# Patient Record
Sex: Female | Born: 1985 | Race: Black or African American | Hispanic: No | Marital: Married | Smoking: Never smoker
Health system: Southern US, Community
[De-identification: ages and names within clinical notes are randomized; demographics above are authoritative.]

## PROBLEM LIST (undated history)

## (undated) DIAGNOSIS — E782 Mixed hyperlipidemia: Secondary | ICD-10-CM

## (undated) DIAGNOSIS — N200 Calculus of kidney: Secondary | ICD-10-CM

## (undated) DIAGNOSIS — R7303 Prediabetes: Secondary | ICD-10-CM

## (undated) DIAGNOSIS — F319 Bipolar disorder, unspecified: Secondary | ICD-10-CM

## (undated) DIAGNOSIS — F32A Depression, unspecified: Secondary | ICD-10-CM

## (undated) DIAGNOSIS — Z8679 Personal history of other diseases of the circulatory system: Secondary | ICD-10-CM

## (undated) DIAGNOSIS — F419 Anxiety disorder, unspecified: Secondary | ICD-10-CM

## (undated) DIAGNOSIS — F329 Major depressive disorder, single episode, unspecified: Secondary | ICD-10-CM

## (undated) DIAGNOSIS — I10 Essential (primary) hypertension: Secondary | ICD-10-CM

## (undated) HISTORY — DX: Essential (primary) hypertension: I10

## (undated) HISTORY — DX: Bipolar disorder, unspecified: F31.9

## (undated) HISTORY — PX: TUBAL LIGATION: SHX77

## (undated) HISTORY — PX: LITHOTRIPSY: SUR834

## (undated) HISTORY — DX: Mixed hyperlipidemia: E78.2

## (undated) HISTORY — PX: HERNIA REPAIR: SHX51

## (undated) HISTORY — DX: Calculus of kidney: N20.0

## (undated) HISTORY — DX: Major depressive disorder, single episode, unspecified: F32.9

## (undated) HISTORY — DX: Anxiety disorder, unspecified: F41.9

## (undated) HISTORY — DX: Personal history of other diseases of the circulatory system: Z86.79

## (undated) HISTORY — DX: Depression, unspecified: F32.A

## (undated) HISTORY — DX: Prediabetes: R73.03

---

## 2007-07-21 ENCOUNTER — Emergency Department: Payer: Self-pay | Admitting: Emergency Medicine

## 2008-02-24 ENCOUNTER — Inpatient Hospital Stay (HOSPITAL_COMMUNITY): Admission: AD | Admit: 2008-02-24 | Discharge: 2008-02-24 | Payer: Self-pay | Admitting: Obstetrics & Gynecology

## 2008-03-05 ENCOUNTER — Inpatient Hospital Stay (HOSPITAL_COMMUNITY): Admission: RE | Admit: 2008-03-05 | Discharge: 2008-03-08 | Payer: Self-pay | Admitting: Obstetrics & Gynecology

## 2008-03-06 ENCOUNTER — Encounter: Payer: Self-pay | Admitting: Obstetrics & Gynecology

## 2008-03-12 ENCOUNTER — Emergency Department: Payer: Self-pay | Admitting: Internal Medicine

## 2008-03-12 ENCOUNTER — Inpatient Hospital Stay (HOSPITAL_COMMUNITY): Admission: AD | Admit: 2008-03-12 | Discharge: 2008-03-14 | Payer: Self-pay | Admitting: Obstetrics

## 2009-07-02 ENCOUNTER — Ambulatory Visit: Payer: Self-pay | Admitting: Surgery

## 2009-07-23 ENCOUNTER — Ambulatory Visit: Payer: Self-pay | Admitting: Surgery

## 2009-10-13 ENCOUNTER — Emergency Department: Payer: Self-pay | Admitting: Emergency Medicine

## 2010-02-07 ENCOUNTER — Encounter: Payer: Self-pay | Admitting: Obstetrics

## 2010-05-03 LAB — COMPREHENSIVE METABOLIC PANEL
ALT: 18 U/L (ref 0–35)
ALT: 22 U/L (ref 0–35)
ALT: 25 U/L (ref 0–35)
ALT: 28 U/L (ref 0–35)
AST: 22 U/L (ref 0–37)
AST: 22 U/L (ref 0–37)
AST: 25 U/L (ref 0–37)
AST: 31 U/L (ref 0–37)
Albumin: 2.9 g/dL — ABNORMAL LOW (ref 3.5–5.2)
Albumin: 2.7 g/dL — ABNORMAL LOW (ref 3.5–5.2)
Albumin: 3 g/dL — ABNORMAL LOW (ref 3.5–5.2)
Albumin: 3 g/dL — ABNORMAL LOW (ref 3.5–5.2)
Alkaline Phosphatase: 188 U/L — ABNORMAL HIGH (ref 39–117)
Alkaline Phosphatase: 150 U/L — ABNORMAL HIGH (ref 39–117)
Alkaline Phosphatase: 196 U/L — ABNORMAL HIGH (ref 39–117)
Alkaline Phosphatase: 215 U/L — ABNORMAL HIGH (ref 39–117)
BUN: 5 mg/dL — ABNORMAL LOW (ref 6–23)
BUN: 1 mg/dL — ABNORMAL LOW (ref 6–23)
BUN: 4 mg/dL — ABNORMAL LOW (ref 6–23)
BUN: 6 mg/dL (ref 6–23)
CO2: 22 mEq/L (ref 19–32)
CO2: 23 mEq/L (ref 19–32)
CO2: 27 mEq/L (ref 19–32)
CO2: 28 mEq/L (ref 19–32)
Calcium: 8.5 mg/dL (ref 8.4–10.5)
Calcium: 7.1 mg/dL — ABNORMAL LOW (ref 8.4–10.5)
Calcium: 8.8 mg/dL (ref 8.4–10.5)
Calcium: 8.9 mg/dL (ref 8.4–10.5)
Chloride: 106 mEq/L (ref 96–112)
Chloride: 106 mEq/L (ref 96–112)
Chloride: 106 mEq/L (ref 96–112)
Chloride: 106 mEq/L (ref 96–112)
Creatinine, Ser: 0.43 mg/dL (ref 0.4–1.2)
Creatinine, Ser: 0.41 mg/dL (ref 0.4–1.2)
Creatinine, Ser: 0.52 mg/dL (ref 0.4–1.2)
Creatinine, Ser: 0.7 mg/dL (ref 0.4–1.2)
GFR calc Af Amer: >60 mL/min (ref >60)
GFR calc Af Amer: >60 mL/min (ref >60)
GFR calc Af Amer: >60 mL/min (ref >60)
GFR calc Af Amer: >60 mL/min (ref >60)
GFR calc non Af Amer: >60 mL/min (ref >60)
GFR calc non Af Amer: >60 mL/min (ref >60)
GFR calc non Af Amer: >60 mL/min (ref >60)
GFR calc non Af Amer: >60 mL/min (ref >60)
Glucose, Bld: 76 mg/dL (ref 70–99)
Glucose, Bld: 69 mg/dL — ABNORMAL LOW (ref 70–99)
Glucose, Bld: 84 mg/dL (ref 70–99)
Glucose, Bld: 86 mg/dL (ref 70–99)
Potassium: 3.7 mEq/L (ref 3.5–5.1)
Potassium: 3.5 mEq/L (ref 3.5–5.1)
Potassium: 3.7 mEq/L (ref 3.5–5.1)
Potassium: 3.8 mEq/L (ref 3.5–5.1)
Sodium: 135 mEq/L (ref 135–145)
Sodium: 136 mEq/L (ref 135–145)
Sodium: 138 mEq/L (ref 135–145)
Sodium: 138 mEq/L (ref 135–145)
Total Bilirubin: 0.6 mg/dL (ref 0.3–1.2)
Total Bilirubin: 0.2 mg/dL — ABNORMAL LOW (ref 0.3–1.2)
Total Bilirubin: 0.4 mg/dL (ref 0.3–1.2)
Total Bilirubin: 0.4 mg/dL (ref 0.3–1.2)
Total Protein: 6.3 g/dL (ref 6.0–8.3)
Total Protein: 5.7 g/dL — ABNORMAL LOW (ref 6.0–8.3)
Total Protein: 6 g/dL (ref 6.0–8.3)
Total Protein: 6.4 g/dL (ref 6.0–8.3)

## 2010-05-03 LAB — CBC
HCT: 33.5 % — ABNORMAL LOW (ref 36.0–46.0)
HCT: 30.2 % — ABNORMAL LOW (ref 36.0–46.0)
HCT: 30.4 % — ABNORMAL LOW (ref 36.0–46.0)
HCT: 34.8 % — ABNORMAL LOW (ref 36.0–46.0)
HCT: 37.3 % (ref 36.0–46.0)
Hemoglobin: 11.6 g/dL — ABNORMAL LOW (ref 12.0–15.0)
Hemoglobin: 10.2 g/dL — ABNORMAL LOW (ref 12.0–15.0)
Hemoglobin: 10.2 g/dL — ABNORMAL LOW (ref 12.0–15.0)
Hemoglobin: 11.8 g/dL — ABNORMAL LOW (ref 12.0–15.0)
Hemoglobin: 12.7 g/dL (ref 12.0–15.0)
MCHC: 34.5 g/dL (ref 30.0–36.0)
MCHC: 33.6 g/dL (ref 30.0–36.0)
MCHC: 33.7 g/dL (ref 30.0–36.0)
MCHC: 33.9 g/dL (ref 30.0–36.0)
MCHC: 34 g/dL (ref 30.0–36.0)
MCV: 93.1 fL (ref 78.0–100.0)
MCV: 92.8 fL (ref 78.0–100.0)
MCV: 93.1 fL (ref 78.0–100.0)
MCV: 93.8 fL (ref 78.0–100.0)
MCV: 94.1 fL (ref 78.0–100.0)
Platelets: 194 10*3/uL (ref 150–400)
Platelets: 188 10*3/uL (ref 150–400)
Platelets: 206 10*3/uL (ref 150–400)
Platelets: 210 10*3/uL (ref 150–400)
Platelets: 294 10*3/uL (ref 150–400)
RBC: 3.6 MIL/uL — ABNORMAL LOW (ref 3.87–5.11)
RBC: 3.22 MIL/uL — ABNORMAL LOW (ref 3.87–5.11)
RBC: 3.23 MIL/uL — ABNORMAL LOW (ref 3.87–5.11)
RBC: 3.76 MIL/uL — ABNORMAL LOW (ref 3.87–5.11)
RBC: 4.01 MIL/uL (ref 3.87–5.11)
RDW: 12.7 % (ref 11.5–15.5)
RDW: 12.5 % (ref 11.5–15.5)
RDW: 12.9 % (ref 11.5–15.5)
RDW: 12.9 % (ref 11.5–15.5)
RDW: 13.1 % (ref 11.5–15.5)
WBC: 11.9 10*3/uL — ABNORMAL HIGH (ref 4.0–10.5)
WBC: 10.5 10*3/uL (ref 4.0–10.5)
WBC: 11.3 10*3/uL — ABNORMAL HIGH (ref 4.0–10.5)
WBC: 12.9 10*3/uL — ABNORMAL HIGH (ref 4.0–10.5)
WBC: 9.1 10*3/uL (ref 4.0–10.5)

## 2010-05-03 LAB — URIC ACID
Uric Acid, Serum: 4.4 mg/dL (ref 2.4–7.0)
Uric Acid, Serum: 3.9 mg/dL (ref 2.4–7.0)
Uric Acid, Serum: 4 mg/dL (ref 2.4–7.0)
Uric Acid, Serum: 6.8 mg/dL (ref 2.4–7.0)

## 2010-05-03 LAB — URINALYSIS, DIPSTICK ONLY
Bilirubin Urine: NEGATIVE
Glucose, UA: NEGATIVE mg/dL
Ketones, ur: 15 mg/dL — AB
Nitrite: NEGATIVE
Protein, ur: NEGATIVE mg/dL
Specific Gravity, Urine: 1.02 (ref 1.005–1.030)
Urobilinogen, UA: 0.2 mg/dL (ref 0.0–1.0)
pH: 6.5 (ref 5.0–8.0)

## 2010-05-03 LAB — LACTATE DEHYDROGENASE
LDH: 151 U/L (ref 94–250)
LDH: 166 U/L (ref 94–250)
LDH: 211 U/L (ref 94–250)
LDH: 236 U/L (ref 94–250)

## 2010-05-03 LAB — URINALYSIS, ROUTINE W REFLEX MICROSCOPIC
Bilirubin Urine: NEGATIVE
Glucose, UA: NEGATIVE mg/dL
Hgb urine dipstick: NEGATIVE
Ketones, ur: 15 mg/dL — AB
Nitrite: NEGATIVE
Protein, ur: NEGATIVE mg/dL
Specific Gravity, Urine: 1.015 (ref 1.005–1.030)
Urobilinogen, UA: 0.2 mg/dL (ref 0.0–1.0)
pH: 6.5 (ref 5.0–8.0)

## 2010-05-03 LAB — MAGNESIUM: Magnesium: 5 mg/dL — ABNORMAL HIGH (ref 1.5–2.5)

## 2010-05-03 LAB — RPR: RPR Ser Ql: NONREACTIVE

## 2010-05-31 NOTE — H&P (Signed)
NAME:  Jasmine Barnes, Jasmine Barnes               ACCOUNT NO.:  [id number]   MEDICAL RECORD NO.:  [id number]          PATIENT TYPE:  INP   LOCATION:  9167                          FACILITY:  [facility]   PHYSICIAN:  Roseanna Rainbow, M.D.DATE OF BIRTH:  [birth year]   DATE OF ADMISSION:  03/05/2008  DATE OF DISCHARGE:                              HISTORY & PHYSICAL   CHIEF COMPLAINT:  The patient is a 25 year old para 1 with an estimated  date of confinement of March 20, 2008, with an intrauterine pregnancy at  37.6 weeks with a history of chronic hypertension, now w/rule out  superimposed pregnancy-induced hypertension.   HISTORY OF PRESENT ILLNESS:  The patient had been followed  collaboratively at [facility].  Her blood pressures had  been controlled with Aldomet.  The patient had been seen for routine  visit and antenatal testing at [facility] 4 days ago.  Per the patient, her blood pressures were elevated and the dose of the  Aldomet was increased.  She denies any neurological symptoms today.  However, she does report nausea and vomiting.   ALLERGIES:  PENICILLIN.   MEDICATIONS:  Please see the medication reconciliation form.   OBSTETRIC RISK FACTORS:  Chronic hypertension.   PAST OBSTETRICAL HISTORY:  In November 2004, she was delivered of a live  born female at 64 weeks vaginal delivery and that pregnancy was  complicated by pregnancy-induced hypertension.   PRENATAL LABORATORY DATA:  Chlamydia probe negative.  Urine culture and  sensitivity insignificant growth.  GC probe negative.  One-hour GCT 88.  Hepatitis B surface antigen negative, hematocrit 32.2, hemoglobin 10.9,  HIV nonreactive, platelets 234,000.  Blood type is AB positive, antibody  screen negative, RPR nonreactive, rubella immune, sickle cell negative.  An ultrasound on January 30, 2008, at 32 weeks 6 days, normal amniotic  fluid index, anterior placenta, no previa.  Estimated fetal weight  percentile was at the 67th percentile.   PAST GYNECOLOGIC HISTORY:  There is a history of a D&C.  There is a  history of gonorrhea.   PAST MEDICAL HISTORY:  Iron-deficiency anemia, asthma, depression,  tension headaches.  Please see the above.   PAST SURGICAL HISTORY:  Please see the above oral surgery.   SOCIAL HISTORY:  She is a Lawyer.  She is single, living with the  significant other, does not give any significant history of alcohol  usage, has no significant smoking history.  Stress issues, financial  difficulties.  There is a previous marijuana use.   FAMILY HISTORY:  Remarkable for depression, hypertension.   REVIEW OF SYSTEMS:  NEUROLOGIC:  She denies any headaches, visual  disturbances.  GI: She was complaining of nausea, vomiting.  VITAL SIGNS:  Blood pressure is 160s/100s.  GENERAL:  No apparent distress.  ABDOMEN:  Gravid, nontender.  VAGINA:  Sterile vaginal exam.  The cervix is at least 260% effaced with  the vertex at -2 station.  Fetal heart rate by Doppler 140 beats per  minute.  There was no urinalysis performed in the office.   ASSESSMENT:  Multipara at 37 plus weeks with chronic  hypertension, rule  out superimposed pregnancy-induced hypertension.   PLAN:  Induction of labor.  Low-dose Pitocin per protocol.  We will also  check a PIH panel.  We will start magnesium sulfate for seizure  prophylaxis for severe criteria for PIH.      Roseanna Rainbow, M.D.  Electronically Signed     LAJ/MEDQ  D:  03/05/2008  T:  03/06/2008  Job:  [id number]

## 2011-10-23 ENCOUNTER — Encounter: Payer: Self-pay | Admitting: Obstetrics and Gynecology

## 2012-01-22 ENCOUNTER — Encounter: Payer: Self-pay | Admitting: Maternal and Fetal Medicine

## 2012-03-25 ENCOUNTER — Encounter: Payer: Self-pay | Admitting: Maternal & Fetal Medicine

## 2012-04-22 ENCOUNTER — Observation Stay: Payer: Self-pay

## 2012-04-22 LAB — PIH PROFILE
Anion Gap: 6 — ABNORMAL LOW (ref 7–16)
BUN: 6 mg/dL — ABNORMAL LOW (ref 7–18)
Calcium, Total: 8.7 mg/dL (ref 8.5–10.1)
Chloride: 106 mmol/L (ref 98–107)
Co2: 26 mmol/L (ref 21–32)
Creatinine: 0.49 mg/dL — ABNORMAL LOW (ref 0.60–1.30)
EGFR (African American): >60
EGFR (Non-African Amer.): >60
Glucose: 71 mg/dL (ref 65–99)
HCT: 34.7 % — ABNORMAL LOW (ref 35.0–47.0)
HGB: 11.9 g/dL — ABNORMAL LOW (ref 12.0–16.0)
MCH: 30.4 pg (ref 26.0–34.0)
MCHC: 34.4 g/dL (ref 32.0–36.0)
MCV: 89 fL (ref 80–100)
Osmolality: 272 (ref 275–301)
Platelet: 219 10*3/uL (ref 150–440)
Potassium: 3.4 mmol/L — ABNORMAL LOW (ref 3.5–5.1)
RBC: 3.92 10*6/uL (ref 3.80–5.20)
RDW: 12.2 % (ref 11.5–14.5)
SGOT(AST): 23 U/L (ref 15–37)
Sodium: 138 mmol/L (ref 136–145)
Uric Acid: 3.2 mg/dL (ref 2.6–6.0)
WBC: 10.3 10*3/uL (ref 3.6–11.0)

## 2012-04-22 LAB — URINALYSIS, COMPLETE
Bilirubin,UR: NEGATIVE
Blood: NEGATIVE
Glucose,UR: NEGATIVE mg/dL (ref 0–75)
Ketone: NEGATIVE
Nitrite: NEGATIVE
Ph: 7 (ref 4.5–8.0)
Protein: NEGATIVE
RBC,UR: 1 /HPF (ref 0–5)
Specific Gravity: 1.004 (ref 1.003–1.030)
Squamous Epithelial: 1
WBC UR: 3 /HPF (ref 0–5)

## 2012-04-22 LAB — PROTEIN / CREATININE RATIO, URINE
Creatinine, Urine: 297.7 mg/dL — ABNORMAL HIGH (ref 30.0–125.0)
Protein, Random Urine: 50 mg/dL — ABNORMAL HIGH (ref 0–12)
Protein/Creat. Ratio: 168 mg/gCREAT (ref 0–200)

## 2012-04-25 ENCOUNTER — Observation Stay: Payer: Self-pay | Admitting: Obstetrics and Gynecology

## 2012-04-25 LAB — BASIC METABOLIC PANEL
Anion Gap: 10 (ref 7–16)
BUN: 7 mg/dL (ref 7–18)
Calcium, Total: 8.1 mg/dL — ABNORMAL LOW (ref 8.5–10.1)
Chloride: 107 mmol/L (ref 98–107)
Co2: 22 mmol/L (ref 21–32)
Creatinine: 0.48 mg/dL — ABNORMAL LOW (ref 0.60–1.30)
EGFR (African American): >60
EGFR (Non-African Amer.): >60
Glucose: 95 mg/dL (ref 65–99)
Osmolality: 275 (ref 275–301)
Potassium: 3.3 mmol/L — ABNORMAL LOW (ref 3.5–5.1)
Sodium: 139 mmol/L (ref 136–145)

## 2012-04-25 LAB — RBC: RBC: 3.62 10*6/uL — ABNORMAL LOW (ref 3.80–5.20)

## 2012-04-25 LAB — PLATELET COUNT: Platelet: 221 10*3/uL (ref 150–440)

## 2012-04-25 LAB — SGOT (AST)(ARMC): SGOT(AST): 24 U/L (ref 15–37)

## 2012-04-25 LAB — URIC ACID: Uric Acid: 3.4 mg/dL (ref 2.6–6.0)

## 2012-04-25 LAB — WBC: WBC: 10.4 10*3/uL (ref 3.6–11.0)

## 2012-04-25 LAB — HEMOGLOBIN: HGB: 11.1 g/dL — ABNORMAL LOW (ref 12.0–16.0)

## 2012-04-25 LAB — HEMATOCRIT: HCT: 32.2 % — ABNORMAL LOW (ref 35.0–47.0)

## 2012-05-09 ENCOUNTER — Inpatient Hospital Stay: Payer: Self-pay | Admitting: Obstetrics and Gynecology

## 2012-05-09 LAB — CBC WITH DIFFERENTIAL/PLATELET
Basophil #: 0.1 10*3/uL (ref 0.0–0.1)
Basophil %: 0.9 %
Eosinophil #: 0.1 10*3/uL (ref 0.0–0.7)
Eosinophil %: 1.4 %
HCT: 33.6 % — ABNORMAL LOW (ref 35.0–47.0)
HGB: 11.4 g/dL — ABNORMAL LOW (ref 12.0–16.0)
Lymphocyte #: 2 10*3/uL (ref 1.0–3.6)
Lymphocyte %: 21.1 %
MCH: 29.2 pg (ref 26.0–34.0)
MCHC: 33.9 g/dL (ref 32.0–36.0)
MCV: 86 fL (ref 80–100)
Monocyte #: 0.6 x10 3/mm (ref 0.2–0.9)
Monocyte %: 6.7 %
Neutrophil #: 6.5 10*3/uL (ref 1.4–6.5)
Neutrophil %: 69.9 %
Platelet: 206 10*3/uL (ref 150–440)
RBC: 3.9 10*6/uL (ref 3.80–5.20)
RDW: 12.2 % (ref 11.5–14.5)
WBC: 9.3 10*3/uL (ref 3.6–11.0)

## 2012-05-09 LAB — BASIC METABOLIC PANEL
Anion Gap: 7 (ref 7–16)
BUN: 5 mg/dL — ABNORMAL LOW (ref 7–18)
Calcium, Total: 8.2 mg/dL — ABNORMAL LOW (ref 8.5–10.1)
Chloride: 106 mmol/L (ref 98–107)
Co2: 23 mmol/L (ref 21–32)
Creatinine: 0.53 mg/dL — ABNORMAL LOW (ref 0.60–1.30)
EGFR (African American): >60
EGFR (Non-African Amer.): >60
Glucose: 74 mg/dL (ref 65–99)
Osmolality: 268 (ref 275–301)
Potassium: 3.7 mmol/L (ref 3.5–5.1)
Sodium: 136 mmol/L (ref 136–145)

## 2012-05-09 LAB — DRUG SCREEN, URINE
Amphetamines, Ur Screen: NEGATIVE (ref ?–1000)
Barbiturates, Ur Screen: NEGATIVE (ref ?–200)
Benzodiazepine, Ur Scrn: NEGATIVE (ref ?–200)
Cannabinoid 50 Ng, Ur ~~LOC~~: NEGATIVE (ref ?–50)
Cocaine Metabolite,Ur ~~LOC~~: NEGATIVE (ref ?–300)
MDMA (Ecstasy)Ur Screen: POSITIVE (ref ?–500)
Methadone, Ur Screen: NEGATIVE (ref ?–300)
Opiate, Ur Screen: NEGATIVE (ref ?–300)
Phencyclidine (PCP) Ur S: NEGATIVE (ref ?–25)
Tricyclic, Ur Screen: NEGATIVE (ref ?–1000)

## 2012-05-09 LAB — URIC ACID: Uric Acid: 3.2 mg/dL (ref 2.6–6.0)

## 2012-05-09 LAB — SGOT (AST)(ARMC): SGOT(AST): 22 U/L (ref 15–37)

## 2012-05-09 LAB — PROTEIN / CREATININE RATIO, URINE
Creatinine, Urine: 215.4 mg/dL — ABNORMAL HIGH (ref 30.0–125.0)
Protein, Random Urine: 49 mg/dL — ABNORMAL HIGH (ref 0–12)
Protein/Creat. Ratio: 227 mg/gCREAT — ABNORMAL HIGH (ref 0–200)

## 2012-05-10 LAB — HEMATOCRIT: HCT: 30.1 % — ABNORMAL LOW (ref 35.0–47.0)

## 2012-05-22 ENCOUNTER — Ambulatory Visit: Payer: Self-pay | Admitting: Obstetrics and Gynecology

## 2012-05-22 LAB — CBC
HCT: 37 % (ref 35.0–47.0)
HGB: 12.7 g/dL (ref 12.0–16.0)
MCH: 29.4 pg (ref 26.0–34.0)
MCHC: 34.4 g/dL (ref 32.0–36.0)
MCV: 86 fL (ref 80–100)
Platelet: 312 10*3/uL (ref 150–440)
RBC: 4.32 10*6/uL (ref 3.80–5.20)
RDW: 12.8 % (ref 11.5–14.5)
WBC: 8 10*3/uL (ref 3.6–11.0)

## 2012-06-11 ENCOUNTER — Ambulatory Visit: Payer: Self-pay | Admitting: Obstetrics and Gynecology

## 2012-09-27 ENCOUNTER — Emergency Department: Payer: Self-pay | Admitting: Emergency Medicine

## 2012-09-27 LAB — BASIC METABOLIC PANEL
Anion Gap: 4 — ABNORMAL LOW (ref 7–16)
BUN: 13 mg/dL (ref 7–18)
Calcium, Total: 9.5 mg/dL (ref 8.5–10.1)
Chloride: 105 mmol/L (ref 98–107)
Co2: 28 mmol/L (ref 21–32)
Creatinine: 0.65 mg/dL (ref 0.60–1.30)
EGFR (African American): >60
EGFR (Non-African Amer.): >60
Glucose: 88 mg/dL (ref 65–99)
Osmolality: 273 (ref 275–301)
Potassium: 3.7 mmol/L (ref 3.5–5.1)
Sodium: 137 mmol/L (ref 136–145)

## 2012-09-27 LAB — HEPATIC FUNCTION PANEL A (ARMC)
Albumin: 4.1 g/dL (ref 3.4–5.0)
Alkaline Phosphatase: 101 U/L (ref 50–136)
Bilirubin, Direct: <0.1 mg/dL (ref 0.00–0.20)
Bilirubin,Total: 0.3 mg/dL (ref 0.2–1.0)
SGOT(AST): 30 U/L (ref 15–37)
SGPT (ALT): 32 U/L (ref 12–78)
Total Protein: 8 g/dL (ref 6.4–8.2)

## 2012-09-27 LAB — DIFFERENTIAL
Basophil #: 0.1 10*3/uL (ref 0.0–0.1)
Basophil %: 1.3 %
Eosinophil #: 0.2 10*3/uL (ref 0.0–0.7)
Eosinophil %: 2.6 %
Lymphocyte #: 1.8 10*3/uL (ref 1.0–3.6)
Lymphocyte %: 27.8 %
Monocyte #: 0.5 x10 3/mm (ref 0.2–0.9)
Monocyte %: 7.2 %
Neutrophil #: 4 10*3/uL (ref 1.4–6.5)
Neutrophil %: 61.1 %

## 2012-09-27 LAB — URINALYSIS, COMPLETE
Bilirubin,UR: NEGATIVE
Glucose,UR: NEGATIVE mg/dL (ref 0–75)
Hyaline Cast: 2
Ketone: NEGATIVE
Leukocyte Esterase: NEGATIVE
Nitrite: NEGATIVE
Ph: 7 (ref 4.5–8.0)
Protein: 30
RBC,UR: 2 /HPF (ref 0–5)
Specific Gravity: 1.026 (ref 1.003–1.030)
Squamous Epithelial: 7
WBC UR: 6 /HPF (ref 0–5)

## 2012-09-27 LAB — SEDIMENTATION RATE: Erythrocyte Sed Rate: 16 mm/hr (ref 0–20)

## 2012-09-27 LAB — HEMOGLOBIN: HGB: 13.8 g/dL (ref 12.0–16.0)

## 2012-09-27 LAB — TROPONIN I: Troponin-I: <0.02 ng/mL

## 2013-03-23 ENCOUNTER — Emergency Department: Payer: Self-pay | Admitting: Emergency Medicine

## 2014-01-23 ENCOUNTER — Ambulatory Visit: Payer: Self-pay

## 2014-01-29 ENCOUNTER — Ambulatory Visit: Payer: Self-pay | Admitting: Urology

## 2014-02-05 ENCOUNTER — Ambulatory Visit: Payer: Self-pay | Admitting: Urology

## 2014-03-09 ENCOUNTER — Ambulatory Visit: Payer: Self-pay | Admitting: Urology

## 2014-03-16 DIAGNOSIS — R519 Headache, unspecified: Secondary | ICD-10-CM | POA: Insufficient documentation

## 2014-03-16 DIAGNOSIS — R51 Headache: Secondary | ICD-10-CM

## 2014-03-18 ENCOUNTER — Ambulatory Visit: Payer: Self-pay

## 2014-03-20 ENCOUNTER — Emergency Department: Payer: Self-pay | Admitting: Emergency Medicine

## 2014-03-26 ENCOUNTER — Ambulatory Visit: Payer: Self-pay | Admitting: Urology

## 2014-04-06 ENCOUNTER — Ambulatory Visit: Payer: Self-pay | Admitting: Urology

## 2014-05-05 NOTE — Consult Note (Signed)
  Allergies:   Penicillin: Unknown              Electronic Signatures: Sharyn Creamer (MD)  (Signed 30-Sep-13 15:35)  Authored: Referral, Home Medications, Allergies, Consult, Lab/Radiology Notes, Impression, Other Comments Rahimtoola, Diane (RN)  (Signed 30-Sep-13 16:02)  Authored: Referral, Consult, Lab/Radiology Notes, Impression, Other Comments   Last Updated: 30-Sep-13 16:02 by Thomes Dinning (RN)

## 2014-05-05 NOTE — Consult Note (Signed)
Referral Information:   Reason for Referral 29 yo G9 P2062 LMP 08/15/11 with EDD 05/27/12 by US performed at [facility] on 10/03/11. Presently 9 weeks 3 days and referred due to poorly controllled hypertension(dx at age 23) and history of severe preeclampsia.    Referring Physician [facility]    Prenatal Hx as above.    Past Obstetrical Hx 2004, 39 weeks, 6lb 11 oz female, induced secondary to worsening hypertension.  [place], [place] 2010, 38 weeks, 6lb 10 oz, femaile, induced secondary to severe preeclampsia, [place], [place]. Reports visual problems, no edema, or pulm edema per pt. Reports SOB and follow up cardiology appt after 6w.  States cardiac exam was negative. Tab x 5 Sab x 1 H/o GC (treated) years ago   Home Medications: Medication Instructions Status  Zofran 8 mg oral tablet 1 tab(s) orally 2 times a day, As Needed- for Nausea, Vomiting  Active  labetalol 100 mg oral tablet 0.5 tab(s) orally once a day (at bedtime) Active   Allergies:   Penicillin: Swelling  Vital Signs/Notes:  Nursing Vital Signs: **Vital Signs.:   07-Oct-13 09:10   Vital Signs Type Routine   Temperature Temperature (F) 98.6   Celsius 37   Temperature Source oral   Pulse Pulse 76   Pulse source if not from Vital Sign Device dinamap   Respirations Respirations 16   Systolic BP Systolic BP 045   Diastolic BP (mmHg) Diastolic BP (mmHg) 89   Mean BP 112   BP Source  if not from Vital Sign Device dinamap,  rt arm large cuff pt sitting    09:15   Vital Signs Type Recheck   Pulse Pulse 83   Pulse source if not from Vital Sign Device dinamap   Respirations Respirations 16   Systolic BP Systolic BP 409   Diastolic BP (mmHg) Diastolic BP (mmHg) 811   Mean BP 132   BP Source  if not from Vital Sign Device dinamap, lf arm, pt sitting   Perinatal Consult:   PMed Hx Hx of varicella    Past Medical History cont'd Pt denies history of TB expsoure Childhood asthma    PSurg Hx Hernia Repair, 2011    FHx  father with hypertension, cardiac disase(enlarged heart)    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  She denies history of cardiomyopathy but does report shortness of breath following her last pregnancy and (per pt.) a negative evaluation by cardiology. 1. Chronic hypertension. Her blood pressure is poorly controlled on 50mg  labetalol daily.  We addressed the risk for recurrent preeclampsia, growth restriction and abruption.  We also addressed the risk for iatrogenic preterm birth in the setting of severe, early onset preeclampsia and the potential risk reduction associated with use of low dose aspirin.  She reports shortness of breath following that pregnancy and in association with exertion in current pregnancy. 2. Hyperemesis--she reports continued nausea/vomitting (  primarilyin mornings) Required hospitalization during last pregnancy for hyperemesis.  She is presently taking odansetron with some relief.    Recommendations Advised patient to increase Labetalol dose to 100mg  BID.  She understands may need dosage titrated upwards. If poorly tolerated, a different agent (eg. procardia) may be initiated 2. Recommend initation of low dose 'baby' aspirin daily, beginning after [redacted] weeks gestation 3. Recommend baseline p;c ratio, lfts, and chem 7, if not already performed.  4. We addressed surveillance with serial growth scans and antenatal testing, as outlined below.  5. She reports  normal ECG in past.  I would recommend she have a follow up ECG and maternal echocardiogram, as well as a Caridology consult.   6. She reports history of asthma as a child.  She may benefit from an albuterol inhaler if experiences SOB during pregnancy. 7. She is taking Zofran for hyperemesis but continues to report nausea and vomitting (primarily in the morning) I gave her a prescription for  combination Rx (doxalamine/vit B6) in evenings.   Plan:   Prenatal Diagnosis Options First trimester, Level II [place]    Ultrasound at what gestational ages Monthly >24 weeks    Antepartum Testing Starting at 32 weeks, Weekly    Delivery Mode Vaginal    Delivery at what gestational age [redacted] weeks     Total Time Spent with Patient 30 minutes    >50% of visit spent in couseling/coordination of care yes    Office Use Only 99242  Level 2 (8min) NEW office consult exp prob focused   Coding Description: MATERNAL CONDITIONS/HISTORY INDICATION(S).   Chronic HTN.  Electronic Signatures: Manfred Shirts (MD)  (Signed 07-Oct-13 14:05)  Authored: Referral, Home Medications, Allergies, Vital Signs/Notes, Consult, Exam, Lab/Radiology Notes, Impression, Plan, Billing, Coding Description   Last Updated: 07-Oct-13 14:05 by Manfred Shirts (MD)

## 2014-05-08 NOTE — Op Note (Signed)
PATIENT NAME:  WHITE, [place] K MR#:  [id number] DATE OF BIRTH:  11-Dec-1985  DATE OF PROCEDURE:  06/11/2012  PREOPERATIVE DIAGNOSES:  Desires permanent surgical sterilization.  POSTOPERATIVE DIAGNOSES:  Desires permanent surgical sterilization.   OPERATION PERFORMED:  Laparoscopic bilateral tubal ligation via Falope-rings.  ANESTHESIA: General.   PRIMARY SURGEON:  Dr. Anabel Halon  ESTIMATED BLOOD LOSS:  Minimal.  OPERATIVE FLUIDS:  500 mL of crystalloid.   URINE OUTPUT:  200 mL of clear urine.   COMPLICATIONS:  None.   FINDINGS:  Palmar splint entry was utilized given the patient's prior history of umbilical hernia and ventral hernia with mesh placement. Minimal adhesions were noted to the umbilicus.  No mesh was visualized abdominally. The liver had minimal Fitz-Hugh Curtis adhesions, otherwise normal abdominal and pelvic anatomy. After application of each Falope-ring, a good 1 cm knuckle of blanched tube was noted bilaterally.   SPECIMENS REMOVED:  None.  CONDITION FOLLOWING PROCEDURE:  Stable.   PROCEDURE IN DETAIL:  Risks, benefits and alternatives, as well as the permanent nature of the procedure were discussed with the patient prior to proceeding to the operating room. The patient was taken to the operating room where general endotracheal anesthesia was administered. She was positioned in the dorsal lithotomy position using Allen stirrups, prepped and draped in the usual sterile fashion. A thick bladder was straight cath'd with a red rubber catheter. A sterile speculum was then placed. The anterior lip of the cervix was visualized and grasped with a single-tooth tenaculum. A Hulka tenaculum was then placed and a single-tooth tenaculum and sterile speculum were removed. Following placement of the Hulka tenaculum, attention was turned to the patient's abdomen.  The area of Palmer's point was identified, infiltrated with 1% Sensorcaine. A stab incision was made and Veress needle  was inserted but opening pressure was noted to be 10. Veress needle insufflation was therefore aborted and direct entry using an XL trocar was accomplished and successful. Following placement of the XL trocar, pneumoperitoneum was established. An 8 mm trocar was then placed suprapubically under direct visualization. The inspection of the abdomen noted the above findings. Attention was turned to the patient's right tube. It was grasped from a mid isthmic portion with the Falope ring applicator. The Falope ring was applied noting a good 1 cm blanched knuckle of tube. Attention was then turned to the patient's left tube which was ligated using Falope-rings in a similar manner, also noting a good 1 cm knuckle of blanching tube. The pneumoperitoneum was then evacuated. The trocars were removed. Sponge, needle and instrument counts were correct x 2. The skin on the 8 mm port site was closed with 4-0 Monocryl in a subcuticular fashion. Both sites were then dressed with Dermabond.   ____________________________ Stoney Bang. Georgianne Fick, MD ams:ce D: 06/11/2012 16:04:30 ET T: 06/11/2012 17:11:46 ET JOB#: [id number]  cc: Stoney Bang. Georgianne Fick, MD, <Dictator> Conan Bowens Madelon Lips MD ELECTRONICALLY SIGNED 07/02/2012 9:29

## 2014-05-08 NOTE — Op Note (Signed)
PATIENT NAME:  WHITE, [place] K MR#:  [id number] DATE OF BIRTH:  [birth year]  DATE OF PROCEDURE:  07/02/2012  PREOPERATIVE DIAGNOSES: Morbid obesity associated with obstructive sleep apnea, also presence of hiatal hernia.  POSTOPERATIVE DIAGNOSIS: Morbid obesity associated with obstructive sleep apnea, also presence of hiatal hernia, with a 6 to 7 mm nodule on the anterior wall of the fundus.   PROCEDURES PERFORMED: Laparoscopic gastric bypass with repair of hiatal hernia with inclusion of relaxing incision of the right hemidiaphragm, partial gastrectomy with resection of  fundus of stomach associated with serosal nodule, intraoperative endoscopy.   SURGEON: Venia Carbon. Duke Salvia, MD  ASSISTANT:  Darrin Luis, PA.  DESCRIPTION OF PROCEDURE:  The patient was brought to the operating room and placed in the supine position. General anesthesia was obtained with orotracheal intubation. The abdomen was sterilely prepped and draped.  Prior to this Foley catheter was inserted sterilely. TED hose and Thromboguards were applied and a foot board applied at the end of the operative bed.  A 5 mm trocar introduced in the left upper quadrant of the abdomen under direct visualization. Pneumoperitoneum obtained with carbon dioxide. The patient then had 4 additional trocars introduced across the upper abdomen and a Nathanson liver retractor introduced through a subxiphoid wound. The omentum was elevated and then divided in the region of the mid transverse colon. The ligament of Treitz was then identified and followed distally 50 cm.  The small bowel was then secured to the anterior gastric wall for future use. The patient was then placed in a steep reverse Trendelenburg position and the left lobe of the liver fully elevated. The patient was noted to have a hiatal hernia. In addition to this, there was a rounded nodule on the anterior fundus lateral to the area of anticipated proximal gastric pouch. No other abnormalities  appreciated in this area. The patient had division of the gastrohepatic ligament. It was then followed by division of the herniated peritoneum, in the region of the upper stomach. Blunt dissection was used to reduce the herniated peritoneum and fat pad away from the overlying pericardium. The patient then had release of ligamentous attachments to the upper fundus to the undersurface of the left hemidiaphragm. A portion of the redundant fat pad was then excised at this point. Next, the peritoneum was incised along the right crural margin and blunt dissection was used to reduce herniated lesser sac fatty tissue and then the patient had further release of phrenoesophageal ligaments along the left crural margin. Next, circumferential dissection of the lower esophagus was performed sweeping away from the pleural surfaces on the right and left side. This was also mobilized from the underlying aorta and the overlying pericardium. The dissection was extended into the lower mediastinum over a distance of approximately 6 to 7 cm resulting in delivery of 2 cm of esophagus lying comfortably in the abdominal cavity. Posterior crural repair was then initiated. Three interrupted 0 Ethibond sutures were used to approximate the crural musculature posteriorly. There was however noted to be increasing tension in the right diaphragmatic musculature and for that reason a relaxing incision of the diaphragmatic fascia was pursued. This was accomplished with division of the thickened fascia at a point midway between the vena cava and the upper apex of the hiatus. The fascia being released with use of the Harmonic scalpel exposing underlying peritoneal fat pad. The release was extended inferiorly to a point parallel to the upper crural suture, released in a superior direction following the curve of  the hiatus until a point in which the fascia was not able to approach the pericardium. The iatrogenic defect was noted to measure approximately  4 cm in length and brought into a diameter of approximately 1.5 cm resulting in significant reduction in tension of the diaphragmatic closure. The patient then had division of the lesser curvature arcade vessels approximately 5 cm inferior to the GE junction with the aid of the Harmonic scalpel. The patient then had creation of a proximal gastric pouch with use of a purple load GIA stapler with seam guard buttress system. This was initiated with transverse firing of the stapler. This was then followed by a vertical line of staples placed parallel to the lesser curvature and brought out just lateral to the angle of His leaving a very small dog ear of stomach. It should be noted that lateral to this, on the body of the fundus anteriorly, was a 7 mm hypervascular nodule. This was consistent with soft tissue tumor and it was decided to proceed at this point with resection of the fundus of the stomach. There was accomplished with division of the short gastric vessels lateral to this with use of the Harmonic scalpel. The patient then had completion of a staple line across the upper stomach using green load GIA stapler with Seamguard reinforcement system. The upper fundus was then placed in a specimen bag and delivered through the 12 mm right upper quadrant trocar site. The prior mobilized fundus was then brought into opposition of the distal posterior aspect of the stomach where enterotomies were made and a blue load stapler was used, at a 2 cm mark, to create a common lumen. The resulting enterotomy closed with a running 2-0 Polysorb suture. The suture and staple lines were then reinforced with an additional running 2-0 Polysorb suture. The latter was performed with a 34-French bougie through the region of the anastomosis. Next, the bowel was addressed proximal to the anastomosis. A window was made in the mesentery adjacent to the bowel just proximal to the anastomosis. A white load stapler was then used to divide the  jejunum at this point. A small additional division of the mesentery was performed with use of the Harmonic scalpel. Next, the Roux limb was followed distally 125 cm at which point a side-to-side jejunal/jejunal anastomosis was configured with the new biliary limb. Enterotomy was made in the antimesenteric border of both the biliary limb and the common channel portion of jejunum. A white load stapler was used to create a common lumen and the resulting enterotomy closed with a repeat firing of white load GI stapler. The patient had anti-torsion sutures placed distal to the anastomosis using 2-0 Surgidac suture and the mesenteric window closed with a running 2-0 Surgidac suture. Next, the jejunum was occluded just distal to gastrojejunal anastomosis and intraoperative endoscopy performed. There was no evidence of an air leak noted with a saline bath in this region. The scope was withdrawn, and the patient then had closure of Peterson defect with a running 2-0 Surgidac suture. The patient had then divided limbs of the omentum secured over the region of the gastrojejunal anastomosis. This was done with a Surgidac suture. It should be noted during closure of the Petersons defect, there was bleeding from 1 of the suture sites. The suture was pulled through this area and the patient did develop a small hematoma of the mesentery, however, this appeared to be stable after a period of observation. There was no evidence of loss of adequate  perfusion of the jejunum involving the Roux limb at this point. After confirming hemostasis at the jejunal-jejunal anastomotic site, the pneumoperitoneum was relieved. The trocars were removed. The wounds were injected with 0.25 Marcaine followed by 4-0 Monocryl in the dermis followed by Dermabond. ____________________________ Venia Carbon. Duke Salvia, MD mat:sb D: 07/08/2012 08:43:33 ET T: 07/08/2012 09:12:40 ET JOB#: [id number]  cc: Legrand Como A. Duke Salvia, MD, <Dictator>

## 2014-05-08 NOTE — Consult Note (Signed)
Referral Information:   Reason for Referral 29 yo G9 P2062 LMP 08/15/11 with EDD 05/21/12 by US performed at [facility] on 10/03/11. Presently 22 weeks 6 days and was initially referred due to poorly controllled hypertension(dx at age 29) and history of severe preeclampsia. Today, the patient presents for detailed ultrasound and follow up.    Referring Physician [facility]    Prenatal Hx as above.    Past Obstetrical Hx 2004, 39 weeks, 6lb 11 oz female, induced secondary to worsening hypertension.  [place], [place] 2010, 38 weeks, 6lb 10 oz, femaile, induced secondary to severe preeclampsia, [place], . Reports visual problems, no edema, or pulm edema per pt. Reports SOB and follow up cardiology appt after 6w.  States cardiac exam was negative. Tab x 5 Sab x 1 H/o GC (treated) years ago   Allergies:   Penicillin: Swelling  Vital Signs/Notes:  Nursing Vital Signs: **Vital Signs.:   06-Jan-14 08:00   Vital Signs Type Routine   Temperature Temperature (F) 97.9   Celsius 36.6   Pulse Pulse 104   Respirations Respirations 18   Systolic BP Systolic BP 572   Diastolic BP (mmHg) Diastolic BP (mmHg) 77   Mean BP 97   Impression/Recommendations:   Impression 29 yo [id number] at 22 weeks 6 days with history of chronic hypertension and severe preeclampsia in her last pregnancy.  She denies history of cardiomyopathy but does report shortness of breath following her last pregnancy and (per pt.) a negative evaluation by cardiology. 1. Chronic hypertension. Her blood pressure is adequately controlled on 300 mg labetalol twice daily.    Recommendations Continue Labetalol dose to 300 mg twice daily.   Plan:   Prenatal Diagnosis Options First trimester, Level II [place]    Ultrasound at what gestational ages Monthly >24 weeks    Antepartum Testing Starting at 32 weeks, Weekly    Delivery Mode Vaginal    Delivery at what gestational age [redacted] weeks     Total Time Spent with Patient 15 minutes     >50% of visit spent in couseling/coordination of care yes    Office Use Only 99242  Level 2 (73min) NEW office consult exp prob focused, [id number]  Office Visit Level 3 (86min) EST exp prob focused outpt   Coding Description: MATERNAL CONDITIONS/HISTORY INDICATION(S).   HTN - Chronic.  Electronic Signatures: Sande Rives (MD)  (Signed 06-Jan-14 10:22)  Authored: Referral, Allergies, Vital Signs/Notes, Impression, Plan, Billing, Coding Description   Last Updated: 06-Jan-14 10:22 by Sande Rives (MD)

## 2014-05-26 NOTE — H&P (Signed)
L&D Evaluation:  History:  HPI 29 year old G9 P2062 with EDC=05/21/2012 by LMp=08/14/2012 presents at 35 6/7 weeks from the office for a PIH eval after presenting for a HROB visit with a blood pressure of 168/110. She was late and was rushing this AM. History of CHTN currently on labetalol 200 mgm BID. Has a chronic headache which was present before she started labetalol. Denies CP, SOB, RUQ pain, etc. Has had some recent dysuria, pelvic pressure, and urinary frequency. ROS also positive for irregular contractions. Denies LOF or VB. Baby has been active. Last took her labetalol late last night and had not taken morning dose yet.Patient has a hx of CHTN and superimposed preeclampsia with her last delivery. She also has a history of anxiety and depression and possibly bipolar disorder noted on outside records. Prenatal care at [facility] begun in first trimester and her dating was confirmed with a 7 week ultrasound. Has been followed with ultrasounds for growth and at 32 weeks with antepartum testing. Last [place] for growth at 31 weeks reveals EFW at the 60th % and normal AFI. NSTs in office have been reactive. AB POS, RI, Varicella equivocal.   Presents with elevated blood pressure   Patient's Medical History CHTN, Anxiety/depression   Patient's Surgical History Hernia repair   Medications Pre Natal Vitamins  labetalol 200 mgm BID   Allergies PCN, hives   Social History drugs  MJ   Family History Non-Contributory   ROS:  ROS see HPI   Exam:  Vital Signs 138/86, 143/90, 141/86, 132/90   Urine Protein PC ratio=168 mgm   General no apparent distress   Mental Status clear   Abdomen gravid, non-tender   Estimated Fetal Weight Average for gestational age   Fetal Position vtx   Edema no edema   Pelvic vestibule inflammed. white to off white discharge. wet prep positive for hyphae. cx:3/50%/-1   Mebranes Intact   FHT normal rate with no decels, 135 with accels to 150s   FHT Description CAT1    Fetal Heart Rate 140   Ucx absent   Skin dry   Other H&H=11.9/34.7, plt=219, Uric acid=3.2, BUN/cr=6/0.49, K+=3.4 all else WNL.   Impression:  Impression IUP at 35 6/7 weeks with no evidence of preeclampsia.  Reactive NST. CHTN with mildly elevated blood pressures. Monilial vulvovaginitis   Plan:  Plan DC home. RX for Diflucan 1590 mgm po today and in 3-4 days. Mycolog II for external irritation. Will have patient collect 24 hour urine and rtn to office for BP check in 2 days. Needs GBS culture then.   Electronic Signatures: Karene Fry (CNM)  (Signed 07-Apr-14 16:22)  Authored: L&D Evaluation   Last Updated: 07-Apr-14 16:22 by Karene Fry (CNM)

## 2014-05-26 NOTE — H&P (Signed)
L&D Evaluation:  History Expanded:  HPI 29 yo [id number] presents wt 39 weeks for induction secondary to chronic hypertension.  Pt also desires a PPTL (forms signed 3/3)   Blood Type (Maternal) AB positive   Group B Strep Results Maternal (Result >5wks must be treated as unknown) negative   Maternal HIV Negative   Maternal Syphilis Ab Nonreactive   Maternal Varicella Equivocal   Rubella Results (Maternal) immune   Presents with induciton for HBp   Patient's Medical History Hypertension   Patient's Surgical History hernia   Medications Pre Natal Vitamins  Labetolol   Allergies PCN, rash   Social History tobacco   Exam:  Vital Signs stable   General no apparent distress   Mental Status clear   Chest clear   Heart normal sinus rhythm   Abdomen gravid, non-tender   Estimated Fetal Weight Average for gestational age   Back no CVAT   Pelvic 3 cm   Mebranes AROM clear   Description clear   FHT normal rate with no decels   Impression:  Impression Chr HBP, 39 weeks, Inductiopn, desires sterilization.   Plan:  Comments Pt has been fully informed of the pros and cons, risk/benefits continued close observation versus the risks of induction. She understands that there are uncommon risks to induction, which include but are not limited to:  frequent and/or prolonged uterine contractions, fetal distress, uterine rupture and lack of success of induction.  She also has been informed that if the induction is not successful a Cesarean Section may be necessary.  All questions have been answered and she is in agreement with induction.   Electronic Signatures: Rosina Lowenstein (MD)  (Signed 24-Apr-14 13:23)  Authored: L&D Evaluation   Last Updated: 24-Apr-14 13:23 by Rosina Lowenstein (MD)

## 2014-08-06 ENCOUNTER — Ambulatory Visit (INDEPENDENT_AMBULATORY_CARE_PROVIDER_SITE_OTHER): Payer: Medicaid Other | Admitting: Psychiatry

## 2014-08-06 ENCOUNTER — Encounter: Payer: Self-pay | Admitting: Psychiatry

## 2014-08-06 VITALS — BP 122/88 | HR 77 | Temp 97.6°F | Ht 62.5 in | Wt 212.6 lb

## 2014-08-06 DIAGNOSIS — F4001 Agoraphobia with panic disorder: Secondary | ICD-10-CM | POA: Diagnosis not present

## 2014-08-06 MED ORDER — ALPRAZOLAM 0.5 MG PO TABS: 0.5000 mg | ORAL_TABLET | Freq: Every evening | ORAL | Status: DC | PRN

## 2014-08-06 MED ORDER — GABAPENTIN 100 MG PO CAPS: 100.0000 mg | ORAL_CAPSULE | Freq: Two times a day (BID) | ORAL | Status: DC

## 2014-08-06 MED ORDER — TOPIRAMATE 100 MG PO TABS: 100.0000 mg | ORAL_TABLET | Freq: Every day | ORAL | Status: DC

## 2014-08-06 NOTE — Progress Notes (Addendum)
BH MD/PA/NP OP Progress Note  08/06/2014 8:49 AM [place] K White  MRN:  [id number]  Subjective:  Patient is a 29 year old female who presented for the follow-up appointment. She reported that she continues to have panic attacks which are getting worse intermittently. She reported that her last panic attack was Monday. She has cut down on her hours and is staying home with her children and enjoying the summer break. She reported that she wants to have improvement in her panic attacks so she can start working at the [place]. She has been taking alprazolam on a when necessary basis. She reported that the Topamax is helping her. She currently denied having any suicidal ideations or plans. She has a strong family history of bipolar disorder but patient does not want to be diagnosed as bipolar. However she appeared hyperactive with pressured speech during the interview. She reported that the Seroquel made her feel worse. Her father takes lithium for   his diagnosis of a schizoaffective disorder. She currently denied use of any drugs or alcohol at this time.   Chief Complaint:  Chief Complaint    Medication Refill; Panic Attack; Follow-up; Anxiety     Visit Diagnosis:     ICD-9-CM ICD-10-CM   1. 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 Anxiety   . Bipolar disorder   . Depression     Past Surgical History  Procedure Laterality Date  . Tubal ligation    . Hernia repair     Family History:  Family History  Problem Relation Age of Onset  . Alcohol abuse Mother   . Drug abuse Mother   . Schizophrenia Father   . Alcohol abuse Father   . Drug abuse Father    Social History:  History   Social History  . Marital Status: Single    Spouse Name: N/A  . Number of Children: N/A  . Years of Education: N/A   Social History Main Topics  . Smoking status: Never Smoker   . Smokeless tobacco: Never Used   . Alcohol Use: No  . Drug Use: No     Comment: quit using marijuana 3 years ago  . Sexual Activity: Yes    Birth Control/ Protection: None   Other Topics Concern  . None   Social History Narrative  . None   Additional History:  She just bought a house in [place] with her husband and spending time with her kids. She has been working for 2 days only. She is on disability and stated that she wants to work in [place].   Assessment:   Musculoskeletal: Strength & Muscle Tone: within normal limits Gait & Station: normal Patient leans: N/A  Psychiatric Specialty Exam: HPI  Review of Systems  Psychiatric/Behavioral: Positive for depression. The patient is nervous/anxious.   All other systems reviewed and are negative.   Blood pressure 122/88, pulse 77, temperature 97.6 F (36.4 C), temperature source Tympanic, height 5' 2.5" (1.588 m), weight 212 lb 9.6 oz (96.435 kg), last menstrual period 07/19/2014, SpO2 95 %.Body mass index is 38.24 kg/(m^2).  General Appearance: Casual  Eye Contact:  Fair  Speech:  Clear and Coherent  Volume:  Normal  Mood:  Anxious  Affect:  Congruent  Thought Process:  Coherent  Orientation:  Full (Time, Place, and Person)  Thought Content:  WDL  Suicidal Thoughts:  No  Homicidal Thoughts:  No  Memory:  Immediate;   Fair  Judgement:  Fair  Insight:  Fair  Psychomotor Activity:  Normal  Concentration:  Fair  Recall:  [employer] of Knowledge: Fair  Language: Fair  Akathisia:  No  Handed:  Right  AIMS (if indicated):  none  Assets:  Communication Skills Desire for Improvement Social Support  ADL's:  Intact  Cognition: WNL  Sleep:  7   Is the patient at risk to self?  No. Has the patient been a risk to self in the past 6 months?  No. Has the patient been a risk to self within the distant past?  No. Is the patient a risk to others?  No.  Has the patient been a risk to others in the past 6 months?  No. Has the patient been a risk to others  within the distant past?  No.  Current Medications: Current Outpatient Prescriptions  Medication Sig Dispense Refill  . ALPRAZolam (XANAX) 0.5 MG tablet Take by mouth.    . ALPRAZolam (XANAX) 0.5 MG tablet   0  . amLODipine (NORVASC) 2.5 MG tablet Take by mouth.    . chlorhexidine (PERIDEX) 0.12 % solution SWISH AND SPIT 15 milliliters by mouth every 12 hours for 10 days  0  . ibuprofen (ADVIL,MOTRIN) 800 MG tablet take 1 tablet by mouth every 6 hours until finished  0  . losartan-hydrochlorothiazide (HYZAAR) 100-25 MG per tablet   0  . topiramate (TOPAMAX) 100 MG tablet Take 100 mg by mouth at bedtime.  0   No current facility-administered medications for this visit.    Medical Decision Making:  Review of Psycho-Social Stressors (1) and Established Problem, Worsening (2)  Treatment Plan Summary:Medication management  Discussed with patient about the medications and she is not interested in taking a mood stabilizer at this time. I will start her on gabapentin 100 mg and advised her to gradually titrate the dose 100  mg by mouth twice a day and she demonstrated understanding.   Continue with Topamax 100 mg by mouth daily at bedtime  continue with Xanax 0.5 mg daily when necessary.  She will follow-up in one month or earlier depending on her symptoms.   More than 50% of the time spent in psychoeducation, counseling and coordination of care.    This note was generated in part or whole with voice recognition software. Voice regonition is usually quite accurate but there are transcription errors that can and very often do occur. I apologize for any typographical errors that were not detected and corrected.   Rainey Pines, M.D  08/06/2014, 8:49 AM

## 2014-09-22 ENCOUNTER — Ambulatory Visit: Payer: Self-pay | Admitting: Psychiatry

## 2014-10-20 ENCOUNTER — Ambulatory Visit (INDEPENDENT_AMBULATORY_CARE_PROVIDER_SITE_OTHER): Payer: Medicaid Other | Admitting: Psychiatry

## 2014-10-20 ENCOUNTER — Encounter: Payer: Self-pay | Admitting: Psychiatry

## 2014-10-20 VITALS — BP 130/84 | HR 74 | Temp 97.9°F | Ht 62.0 in | Wt 211.2 lb

## 2014-10-20 DIAGNOSIS — F4001 Agoraphobia with panic disorder: Secondary | ICD-10-CM

## 2014-10-20 MED ORDER — FLUOXETINE HCL 10 MG PO CAPS: 10.0000 mg | ORAL_CAPSULE | Freq: Every day | ORAL | Status: DC

## 2014-10-20 MED ORDER — GABAPENTIN 100 MG PO CAPS
100.0000 mg | ORAL_CAPSULE | Freq: Two times a day (BID) | ORAL | Status: DC
Start: 2014-10-20 — End: 2015-01-21

## 2014-10-20 MED ORDER — ALPRAZOLAM 0.5 MG PO TABS: 0.5000 mg | ORAL_TABLET | Freq: Every evening | ORAL | Status: DC | PRN

## 2014-10-20 NOTE — Progress Notes (Signed)
BH MD/PA/NP OP Progress Note  10/20/2014 4:05 PM [name]  MRN:  [id number]  Subjective:  Patient is a 29 year old female who presented for the follow-up appointment. She reported that she continues to have panic attacks and has been feeling depressed and reported that she has been talking to her sisters and reported that they are all going to the same symptoms. Patient reported that she was recently started on propranolol by her primary care physician due to worsening headaches. She reported that she stopped her Topamax as it was not helpful and it was making her tired. She reported that she continues to have mood symptoms and has days when she will feel very tired and other days she will feel energetic. She has negative thinking support herself. She reported that she does not enjoy her job. She is currently taking At night which helps her sleep and is also helping with her anxiety. She is interested in starting some medication to control her depression and anxiety at this time. Patient appeared tired during the interview as she was working in the yard before coming for this appointment. She denied use of drugs or alcohol. She denied having any suicidal ideations or plans.   Chief Complaint:  Chief Complaint    Follow-up; Medication Refill     Visit Diagnosis:     ICD-9-CM ICD-10-CM   1. 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 Anxiety   . Bipolar disorder ([place])   . Depression     Past Surgical History  Procedure Laterality Date  . Tubal ligation    . Hernia repair     Family History:  Family History  Problem Relation Age of Onset  . Alcohol abuse Mother   . Drug abuse Mother   . Bipolar disorder Mother   . Schizophrenia Father   . Alcohol abuse Father   . Drug abuse Father    Social History:  Social History   Social History  . Marital Status: Single    Spouse Name: N/A   . Number of Children: N/A  . Years of Education: N/A   Social History Main Topics  . Smoking status: Never Smoker   . Smokeless tobacco: Never Used  . Alcohol Use: No  . Drug Use: No     Comment: quit using marijuana 3 years ago  . Sexual Activity: Yes    Birth Control/ Protection: None   Other Topics Concern  . None   Social History Narrative   Additional History:  She just bought a house in [place] with her husband and spending time with her kids. She has been working for 2 days only. She is on disability and stated that she wants to work in [place].   Assessment:   Musculoskeletal: Strength & Muscle Tone: within normal limits Gait & Station: normal Patient leans: N/A  Psychiatric Specialty Exam: HPI   Review of Systems  Psychiatric/Behavioral: Positive for depression. The patient is nervous/anxious.   All other systems reviewed and are negative.   Blood pressure 130/84, pulse 74, temperature 97.9 F (36.6 C), temperature source Tympanic, height 5\' 2"  (1.575 m), weight 211 lb 3.2 oz (95.8 kg), last menstrual period 10/05/2014, SpO2 98 %.Body mass index is 38.62 kg/(m^2).  General Appearance: Casual  Eye Contact:  Fair  Speech:  Clear and Coherent  Volume:  Normal  Mood:  Anxious  Affect:  Congruent  Thought Process:  Coherent  Orientation:  Full (Time, Place, and Person)  Thought Content:  WDL  Suicidal Thoughts:  No  Homicidal Thoughts:  No  Memory:  Immediate;   Fair  Judgement:  Fair  Insight:  Fair  Psychomotor Activity:  Normal  Concentration:  Fair  Recall:  [employer] of Knowledge: Fair  Language: Fair  Akathisia:  No  Handed:  Right  AIMS (if indicated):  none  Assets:  Communication Skills Desire for Improvement Social Support  ADL's:  Intact  Cognition: WNL  Sleep:  7   Is the patient at risk to self?  No. Has the patient been a risk to self in the past 6 months?  No. Has the patient been a risk to self within the distant past?  No. Is  the patient a risk to others?  No.  Has the patient been a risk to others in the past 6 months?  No. Has the patient been a risk to others within the distant past?  No.  Current Medications: Current Outpatient Prescriptions  Medication Sig Dispense Refill  . ALPRAZolam (XANAX) 0.5 MG tablet Take 1 tablet (0.5 mg total) by mouth at bedtime as needed for anxiety. 30 tablet 1  . amLODipine (NORVASC) 2.5 MG tablet Take by mouth.    . cetirizine (ZYRTEC) 10 MG tablet take 1 tablet by mouth at bedtime for allergies  0  . gabapentin (NEURONTIN) 100 MG capsule Take 1 capsule (100 mg total) by mouth 2 (two) times daily. 60 capsule 1  . losartan-hydrochlorothiazide (HYZAAR) 100-25 MG per tablet   0  . propranolol (INDERAL) 20 MG tablet take 1 tablet by mouth once daily for headache PREVENTION.  0   No current facility-administered medications for this visit.    Medical Decision Making:  Review of Psycho-Social Stressors (1) and Established Problem, Worsening (2)  Treatment Plan Summary:Medication management   Depression Discussed with patient what the medications and will start her on Prozac 10 mg in the morning and she agreed with the plan  Mood destabilization She will continue gabapentin 100 mg  at night   Anxiety continue  with Xanax 0.5 mg daily when necessary.     She will follow-up in two month or earlier depending on her symptoms.   More than 50% of the time spent in psychoeducation, counseling and coordination of care.  Time spent with the patient 2 months   This note was generated in part or whole with voice recognition software. Voice regonition is usually quite accurate but there are transcription errors that can and very often do occur. I apologize for any typographical errors that were not detected and corrected.   Rainey Pines, M.D  10/20/2014, 4:05 PM

## 2014-11-11 NOTE — Progress Notes (Signed)
On pt visit on 10-20-14 pt states that she stopped taking the topamax because it was making her tired.

## 2014-12-31 ENCOUNTER — Ambulatory Visit: Payer: Medicaid Other | Admitting: Psychiatry

## 2015-01-21 ENCOUNTER — Ambulatory Visit (INDEPENDENT_AMBULATORY_CARE_PROVIDER_SITE_OTHER): Payer: Medicare Other | Admitting: Psychiatry

## 2015-01-21 ENCOUNTER — Encounter: Payer: Self-pay | Admitting: Psychiatry

## 2015-01-21 VITALS — BP 122/86 | HR 82 | Temp 98.5°F | Ht 62.0 in | Wt 211.2 lb

## 2015-01-21 DIAGNOSIS — F4001 Agoraphobia with panic disorder: Secondary | ICD-10-CM | POA: Diagnosis not present

## 2015-01-21 MED ORDER — ALPRAZOLAM 0.5 MG PO TABS: 0.5000 mg | ORAL_TABLET | Freq: Every evening | ORAL | Status: DC | PRN

## 2015-01-21 MED ORDER — FLUOXETINE HCL 10 MG PO CAPS: 10.0000 mg | ORAL_CAPSULE | Freq: Every day | ORAL | Status: DC

## 2015-01-21 MED ORDER — GABAPENTIN 100 MG PO CAPS: 100.0000 mg | ORAL_CAPSULE | Freq: Every day | ORAL | Status: DC

## 2015-01-21 NOTE — Progress Notes (Signed)
BH MD/PA/NP OP Progress Note  01/21/2015 2:46 PM [place] K White  MRN:  [id number]  Subjective:  Patient is a 30 year old female who presented for the follow-up appointment. She reported that she has noticed improvement in her symptoms. She reported that she has been taking Neurontin at bedtime. Patient reported that her panic attacks are improving. She also takes Prozac in the morning and feels that most of the symptoms are around her menstrual cycle. She feels that she is understanding her disease in more detail. She is planning to visit her sister in [place] next month. She appeared excited. 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 She appeared hyper and talkative as usual. She currently denied having any mood swings anger anxiety or paranoia. She has been compliant with her medications and brought the bottles of medications during the interview.  She denied use of drugs or alcohol. She denied having any suicidal ideations or plans.   Chief Complaint:  Chief Complaint    Follow-up; Medication Refill     Visit Diagnosis:     ICD-9-CM ICD-10-CM   1. 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 Anxiety   . Bipolar disorder ([place])   . Depression     Past Surgical History  Procedure Laterality Date  . Tubal ligation    . Hernia repair     Family History:  Family History  Problem Relation Age of Onset  . Alcohol abuse Mother   . Drug abuse Mother   . Bipolar disorder Mother   . Schizophrenia Father   . Alcohol abuse Father   . Drug abuse Father    Social History:  Social History   Social History  . Marital Status: Single    Spouse Name: N/A  . Number of Children: N/A  . Years of Education: N/A   Social History Main Topics  . Smoking status: Never Smoker   . Smokeless tobacco: Never Used  . Alcohol Use: No  . Drug Use: No     Comment: quit using marijuana 3  years ago  . Sexual Activity: Yes    Birth Control/ Protection: None   Other Topics Concern  . None   Social History Narrative   Additional History:  She just bought a house in [place] with her husband and spending time with her kids. She has been working for 2 days only. She is on disability and stated that she wants to work in [place].   Assessment:   Musculoskeletal: Strength & Muscle Tone: within normal limits Gait & Station: normal Patient leans: N/A  Psychiatric Specialty Exam: HPI   Review of Systems  Psychiatric/Behavioral: Positive for depression. The patient is nervous/anxious.   All other systems reviewed and are negative.   Blood pressure 122/86, pulse 82, temperature 98.5 F (36.9 C), temperature source Tympanic, height 5\' 2"  (1.575 m), weight 211 lb 3.2 oz (95.8 kg), last menstrual period 01/07/2015, SpO2 99 %.Body mass index is 38.62 kg/(m^2).  General Appearance: Casual  Eye Contact:  Fair  Speech:  Clear and Coherent  Volume:  Normal  Mood:  Anxious  Affect:  Congruent  Thought Process:  Coherent  Orientation:  Full (Time, Place, and Person)  Thought Content:  WDL  Suicidal Thoughts:  No  Homicidal Thoughts:  No  Memory:  Immediate;   Fair  Judgement:  Fair  Insight:  Fair  Psychomotor Activity:  Normal  Concentration:  Fair  Recall:  [employer] of Knowledge: Fair  Language: Fair  Akathisia:  No  Handed:  Right  AIMS (if indicated):  none  Assets:  Communication Skills Desire for Improvement Social Support  ADL's:  Intact  Cognition: WNL  Sleep:  7   Is the patient at risk to self?  No. Has the patient been a risk to self in the past 6 months?  No. Has the patient been a risk to self within the distant past?  No. Is the patient a risk to others?  No.  Has the patient been a risk to others in the past 6 months?  No. Has the patient been a risk to others within the distant past?  No.  Current Medications: Current Outpatient Prescriptions   Medication Sig Dispense Refill  . ALPRAZolam (XANAX) 0.5 MG tablet Take 1 tablet (0.5 mg total) by mouth at bedtime as needed for anxiety. 30 tablet 1  . cetirizine (ZYRTEC) 10 MG tablet take 1 tablet by mouth at bedtime for allergies  0  . FLUoxetine (PROZAC) 10 MG capsule Take 1 capsule (10 mg total) by mouth daily. 30 capsule 3  . losartan-hydrochlorothiazide (HYZAAR) 100-25 MG per tablet   0  . nortriptyline (PAMELOR) 10 MG capsule Take 1 pill at night for one week then increase to 2 pills at night for a week then increase to 3 pills at night    . amLODipine (NORVASC) 2.5 MG tablet Take by mouth. Reported on 01/21/2015    . gabapentin (NEURONTIN) 100 MG capsule Take 1 capsule (100 mg total) by mouth 2 (two) times daily. (Patient not taking: Reported on 01/21/2015) 60 capsule 1  . propranolol (INDERAL) 20 MG tablet Reported on 01/21/2015  0   No current facility-administered medications for this visit.    Medical Decision Making:  Review of Psycho-Social Stressors (1) and Established Problem, Worsening (2)  Treatment Plan Summary:Medication management   Depression Discussed with patient what the medications and continue  Prozac 10 mg in the morning    She will continue gabapentin 100 mg  at night   Anxiety continue  with Xanax 0.5 mg daily when necessary.     She will follow-up in three  month or earlier depending on her symptoms.     This note was generated in part or whole with voice recognition software. Voice regonition is usually quite accurate but there are transcription errors that can and very often do occur. I apologize for any typographical errors that were not detected and corrected.   Rainey Pines, M.D  01/21/2015, 2:46 PM

## 2015-02-01 ENCOUNTER — Telehealth: Payer: Self-pay | Admitting: Obstetrics and Gynecology

## 2015-02-01 DIAGNOSIS — N200 Calculus of kidney: Secondary | ICD-10-CM

## 2015-02-01 NOTE — Telephone Encounter (Signed)
Done

## 2015-02-01 NOTE — Telephone Encounter (Signed)
Patient has a 1-year follow up appointment for renal stone on 04/07/2015.  Please place order for KUB prior to appt.

## 2015-03-02 IMAGING — US US OB FOLLOW-UP - NRPT [facility]
1 series · 14 of 28 positions shown · non-contrast
Comparison: none

[Series 1: us ob follow-up - nrpt [facility] · 0.20mm/px · 14 of 56 slices shown]
[im 3/56]
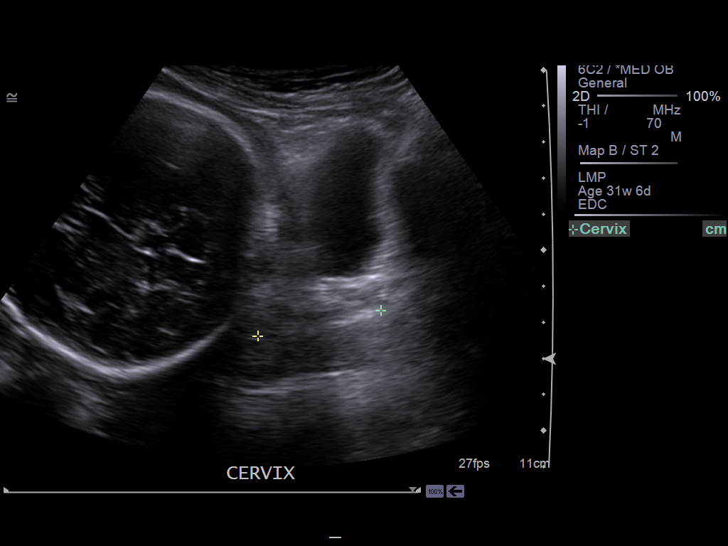
[im 7/56]
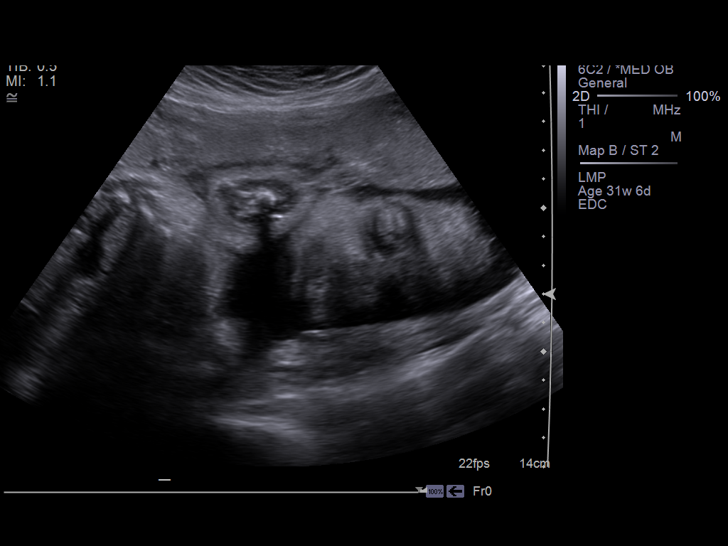
[im 11/56]
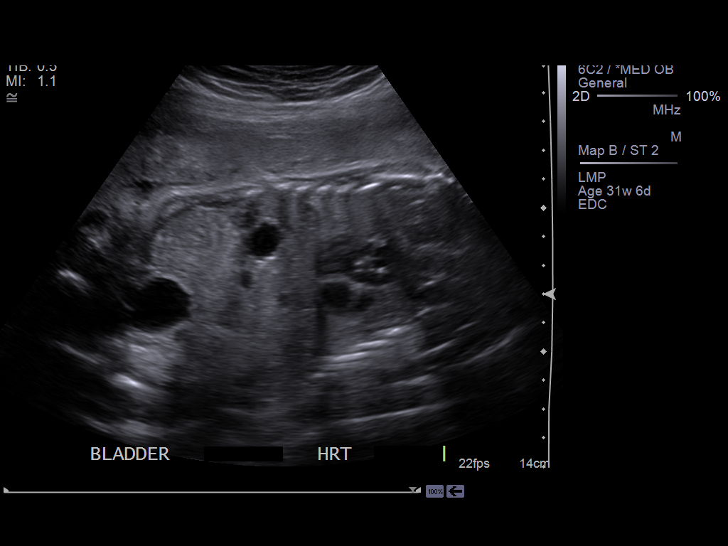
[im 15/56]
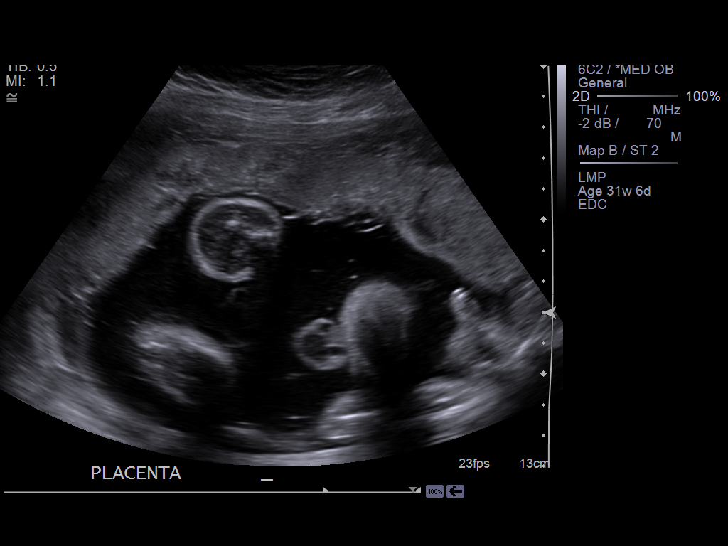
[im 19/56]
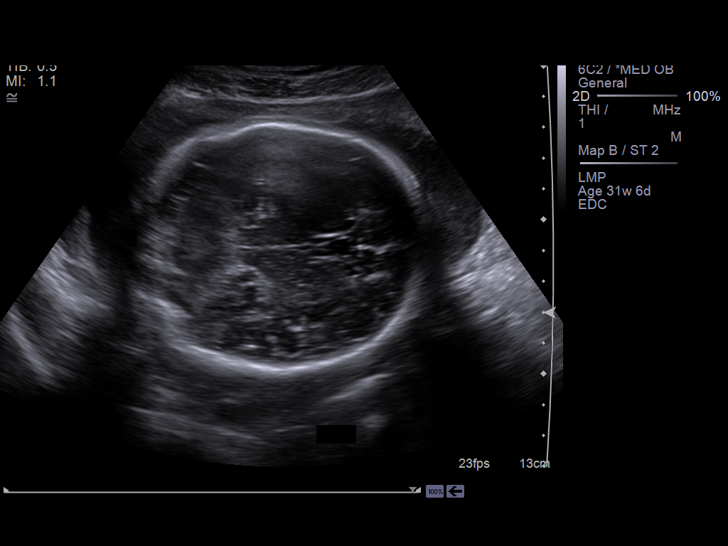
[im 23/56]
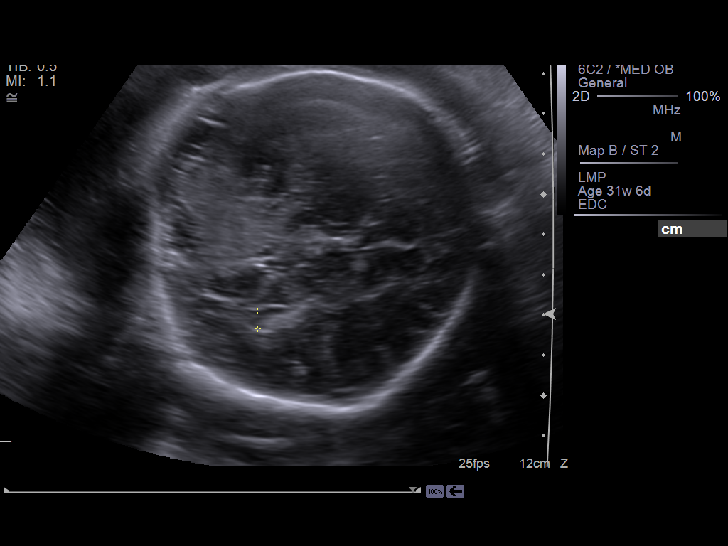
[im 27/56]
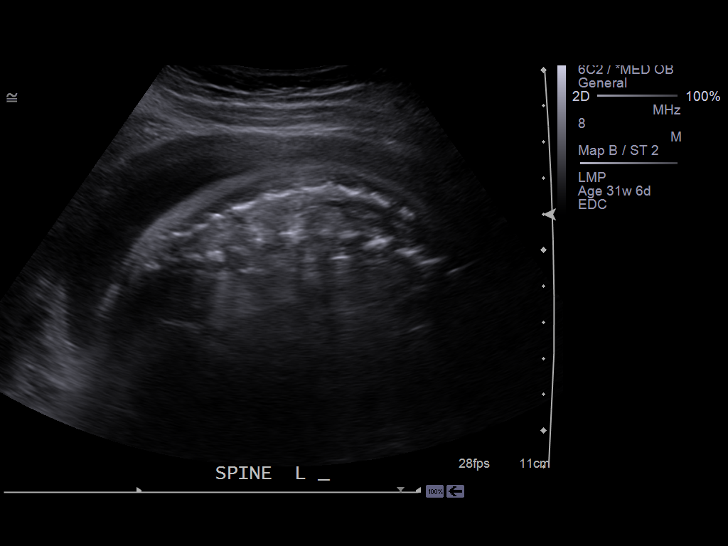
[im 31/56]
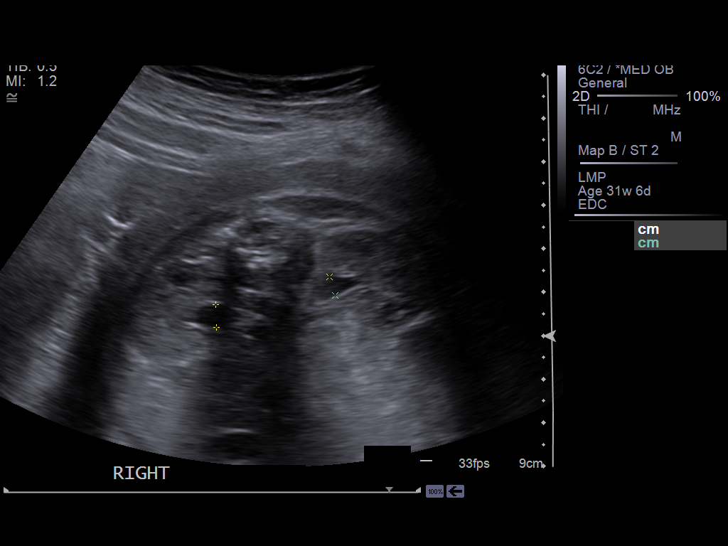
[im 35/56]
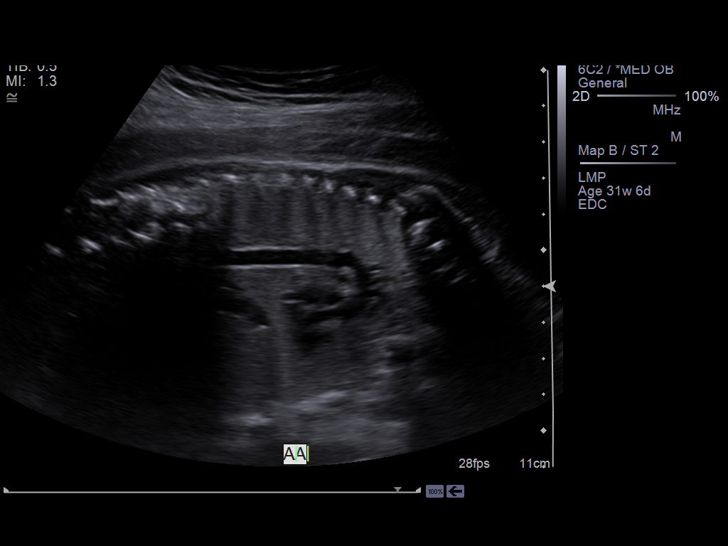
[im 39/56]
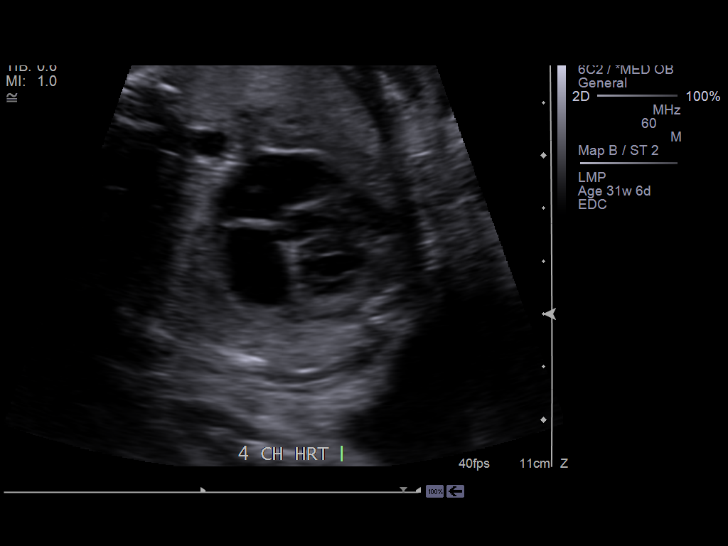
[im 43/56]
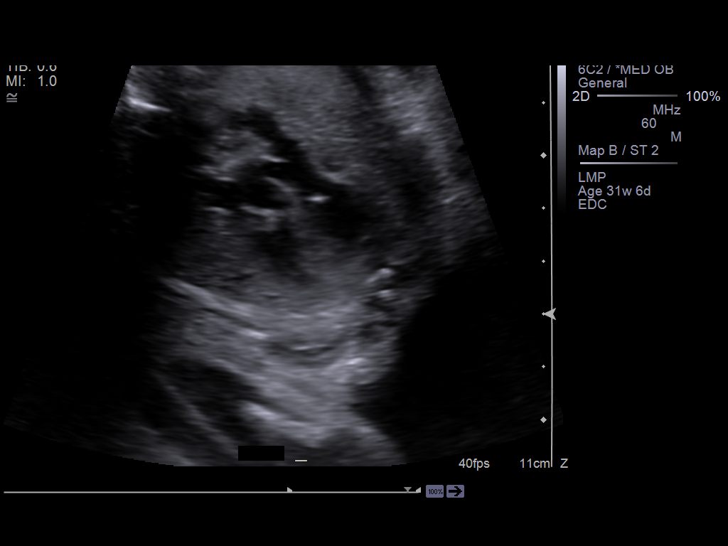
[im 47/56]
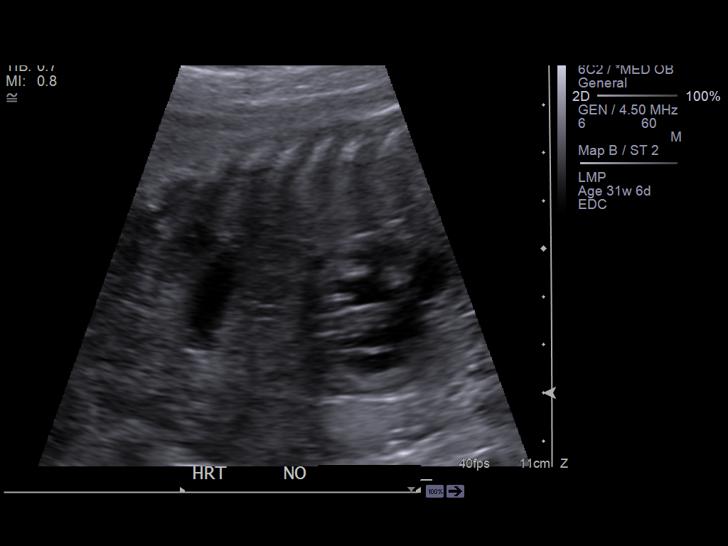
[im 51/56]
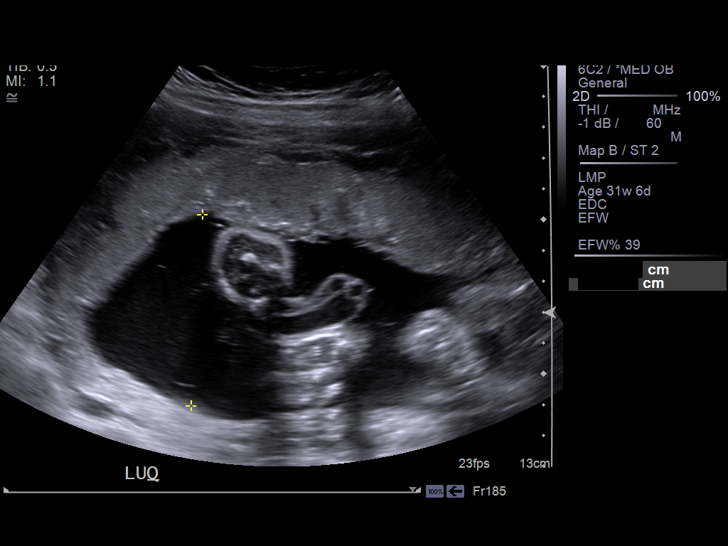
[im 56/56]
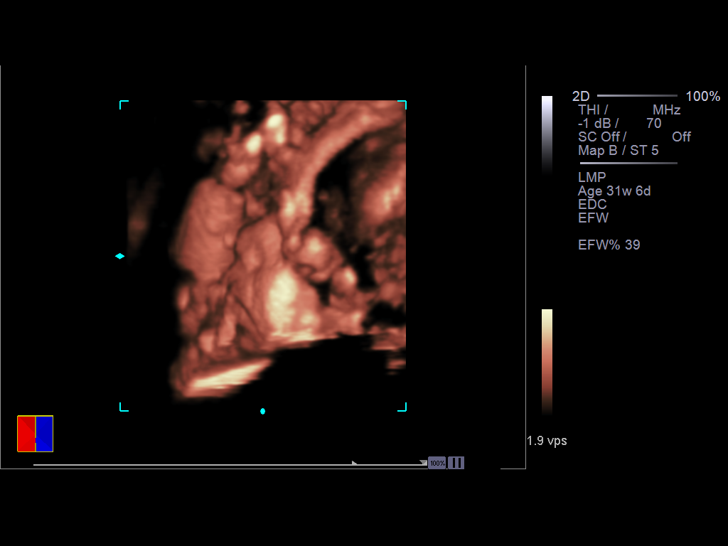

[14 of 28 positions shown; findings below may reference images not displayed]

IMAGES IMPORTED FROM THE [employer] WORKFLOW SYSTEM
NO DICTATION FOR STUDY

## 2015-03-31 ENCOUNTER — Ambulatory Visit (INDEPENDENT_AMBULATORY_CARE_PROVIDER_SITE_OTHER): Payer: Medicare Other | Admitting: Psychiatry

## 2015-03-31 ENCOUNTER — Encounter: Payer: Self-pay | Admitting: Psychiatry

## 2015-03-31 VITALS — BP 122/84 | HR 86 | Temp 97.6°F | Ht 62.0 in | Wt 205.0 lb

## 2015-03-31 DIAGNOSIS — F4001 Agoraphobia with panic disorder: Secondary | ICD-10-CM | POA: Diagnosis not present

## 2015-03-31 MED ORDER — ALPRAZOLAM 0.5 MG PO TABS: 0.5000 mg | ORAL_TABLET | Freq: Every evening | ORAL | Status: DC | PRN

## 2015-03-31 MED ORDER — FLUOXETINE HCL 10 MG PO CAPS: 10.0000 mg | ORAL_CAPSULE | Freq: Every day | ORAL | Status: DC

## 2015-03-31 MED ORDER — LAMOTRIGINE 25 MG PO TABS: 25.0000 mg | ORAL_TABLET | Freq: Every day | ORAL | Status: DC

## 2015-03-31 NOTE — Progress Notes (Signed)
BH MD/PA/NP OP Progress Note  03/31/2015 11:02 AM [place] K White  MRN:  [id number]  Subjective:  Patient is a 30 year old female who presented for the follow-up appointment. She reported that she has noticed improvement in her symptoms. She is applying for new job and is looking forward to be started on a medication to help with her mood symptoms. Patient reported that she stopped taking Neurontin as she was feeling "stuck". Patient reported that she wants to try a new mood stabilizer. She has just followed up with her primary care physician yesterday and they are just managing her blood pressure. She reported that she is trying to lose weight and has been feeling healthier. She appeared calm and cooperative during the interview. She reported that she is taking alprazolam on a when necessary basis and has minimized the use of the medication. She has been sleeping well without any medications at this time.  She appeared hyper and talkative as usual. She currently denied having any mood swings anger anxiety or paranoia. She has been compliant with her medications and brought the bottles of medications during the interview.  She denied use of drugs or alcohol. She denied having any suicidal ideations or plans.   Chief Complaint:  Chief Complaint    Follow-up; Medication Refill     Visit Diagnosis:     ICD-9-CM ICD-10-CM   1. 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 Anxiety   . Bipolar disorder ([place])   . Depression     Past Surgical History  Procedure Laterality Date  . Tubal ligation    . Hernia repair     Family History:  Family History  Problem Relation Age of Onset  . Alcohol abuse Mother   . Drug abuse Mother   . Bipolar disorder Mother   . Schizophrenia Father   . Alcohol abuse Father   . Drug abuse Father    Social History:  Social History   Social History  . Marital Status:  Single    Spouse Name: N/A  . Number of Children: N/A  . Years of Education: N/A   Social History Main Topics  . Smoking status: Never Smoker   . Smokeless tobacco: Never Used  . Alcohol Use: No  . Drug Use: No     Comment: quit using marijuana 3 years ago  . Sexual Activity: Yes    Birth Control/ Protection: None   Other Topics Concern  . None   Social History Narrative   Additional History:  She just bought a house in [place] with her husband and spending time with her kids. She has been working for 2 days only. She is on disability and stated that she wants to work in [place].   Assessment:   Musculoskeletal: Strength & Muscle Tone: within normal limits Gait & Station: normal Patient leans: N/A  Psychiatric Specialty Exam: HPI   Review of Systems  Psychiatric/Behavioral: Positive for depression. The patient is nervous/anxious.   All other systems reviewed and are negative.   Blood pressure 122/84, pulse 86, temperature 97.6 F (36.4 C), temperature source Tympanic, height 5\' 2"  (1.575 m), weight 205 lb (92.987 kg), last menstrual period 03/31/2015, SpO2 92 %.Body mass index is 37.49 kg/(m^2).  General Appearance: Casual  Eye Contact:  Fair  Speech:  Clear and Coherent  Volume:  Normal  Mood:  Anxious  Affect:  Congruent  Thought Process:  Coherent  Orientation:  Full (Time, Place, and Person)  Thought Content:  WDL  Suicidal Thoughts:  No  Homicidal Thoughts:  No  Memory:  Immediate;   Fair  Judgement:  Fair  Insight:  Fair  Psychomotor Activity:  Normal  Concentration:  Fair  Recall:  [employer] of Knowledge: Fair  Language: Fair  Akathisia:  No  Handed:  Right  AIMS (if indicated):  none  Assets:  Communication Skills Desire for Improvement Social Support  ADL's:  Intact  Cognition: WNL  Sleep:  7   Is the patient at risk to self?  No. Has the patient been a risk to self in the past 6 months?  No. Has the patient been a risk to self within the  distant past?  No. Is the patient a risk to others?  No.  Has the patient been a risk to others in the past 6 months?  No. Has the patient been a risk to others within the distant past?  No.  Current Medications: Current Outpatient Prescriptions  Medication Sig Dispense Refill  . ALPRAZolam (XANAX) 0.5 MG tablet Take 1 tablet (0.5 mg total) by mouth at bedtime as needed for anxiety. 30 tablet 2  . amLODipine (NORVASC) 2.5 MG tablet Take by mouth. Reported on 01/21/2015    . cetirizine (ZYRTEC) 10 MG tablet take 1 tablet by mouth at bedtime for allergies  0  . FLUoxetine (PROZAC) 10 MG capsule Take 1 capsule (10 mg total) by mouth daily. 30 capsule 3  . losartan-hydrochlorothiazide (HYZAAR) 100-25 MG per tablet   0  . tretinoin (RETIN-A) 0.025 % cream apply to affected area at bedtime  0   No current facility-administered medications for this visit.    Medical Decision Making:  Review of Psycho-Social Stressors (1) and Established Problem, Worsening (2)  Treatment Plan Summary:Medication management   Depression Discussed with patient what the medications and continue  Prozac 10 mg in the morning    She will be started on lamotrigine 25 mg daily. Advised patient that she needs to continue taking the medication for her mood stabilization and she demonstrated understanding.   Anxiety continue  with Xanax 0.5 mg daily when necessary.     She will follow-up in two  month or earlier depending on her symptoms.     This note was generated in part or whole with voice recognition software. Voice regonition is usually quite accurate but there are transcription errors that can and very often do occur. I apologize for any typographical errors that were not detected and corrected.   Rainey Pines, M.D  03/31/2015, 11:02 AM

## 2015-04-07 ENCOUNTER — Ambulatory Visit: Payer: Self-pay | Admitting: Urology

## 2015-07-08 ENCOUNTER — Ambulatory Visit (INDEPENDENT_AMBULATORY_CARE_PROVIDER_SITE_OTHER): Payer: Medicare Other | Admitting: Psychiatry

## 2015-07-08 ENCOUNTER — Encounter: Payer: Self-pay | Admitting: Psychiatry

## 2015-07-08 VITALS — BP 122/84 | HR 72 | Temp 97.8°F | Ht 62.0 in | Wt 199.6 lb

## 2015-07-08 DIAGNOSIS — F4001 Agoraphobia with panic disorder: Secondary | ICD-10-CM

## 2015-07-08 MED ORDER — VENLAFAXINE HCL ER 75 MG PO CP24: 75.0000 mg | ORAL_CAPSULE | Freq: Every day | ORAL | Status: DC

## 2015-07-08 MED ORDER — ALPRAZOLAM 0.5 MG PO TABS: 0.5000 mg | ORAL_TABLET | Freq: Every day | ORAL | Status: DC

## 2015-07-08 MED ORDER — HYDROXYZINE PAMOATE 25 MG PO CAPS: 25.0000 mg | ORAL_CAPSULE | Freq: Two times a day (BID) | ORAL | Status: DC | PRN

## 2015-07-08 NOTE — Progress Notes (Signed)
BH MD/PA/NP OP Progress Note  07/08/2015 10:13 AM [name]  MRN:  [id number]  Subjective:  Patient is a 30 year old female who presented for the follow-up appointment. She reported that she has experiencing more panic attacks for the past month. She has recently seen her cardiologist Dr. Humphrey Rolls who has diagnosed her with some cardiac issues including blockage in her part his. She had the testing done on Monday and is waiting for the results. She reported that she has appointment tomorrow and she is becoming old record. She is over thinking and over analyzing every scenario and is feeling worse with her panic anxiety symptoms. Patient reported that she is unable to sleep and is unable to eat. She has choking sensations. Patient reported that she is not feeling stable on her current medications and wants her medications to be adjusted. She appeared very hyper during the interview. She has already stopped taking the lamotrigine as she reported that she does not know what these medications are doing to her. We discussed about the different medications during the interview and she reported that she wants to try different medication at this time. She has already lost 20 pounds. She currently denied having any suicidal homicidal ideations or plans.    Chief Complaint:   Visit Diagnosis:     ICD-9-CM ICD-10-CM   1. 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 Anxiety   . Bipolar disorder ([place])   . Depression     Past Surgical History  Procedure Laterality Date  . Tubal ligation    . Hernia repair     Family History:  Family History  Problem Relation Age of Onset  . Alcohol abuse Mother   . Drug abuse Mother   . Bipolar disorder Mother   . Schizophrenia Father   . Alcohol abuse Father   . Drug abuse Father    Social History:  Social History   Social History  . Marital Status: Single   Spouse Name: N/A  . Number of Children: N/A  . Years of Education: N/A   Social History Main Topics  . Smoking status: Never Smoker   . Smokeless tobacco: Never Used  . Alcohol Use: No  . Drug Use: No     Comment: quit using marijuana 3 years ago  . Sexual Activity: Yes    Birth Control/ Protection: None   Other Topics Concern  . Not on file   Social History Narrative   Additional History:  She just bought a house in [place] with her husband and spending time with her kids. She has been working for 2 days only. She is on disability and stated that she wants to work in [place].   Assessment:   Musculoskeletal: Strength & Muscle Tone: within normal limits Gait & Station: normal Patient leans: N/A  Psychiatric Specialty Exam: HPI  Review of Systems  Psychiatric/Behavioral: Positive for depression. The patient is nervous/anxious.   All other systems reviewed and are negative.   There were no vitals taken for this visit.There is no weight on file to calculate BMI.  General Appearance: Casual  Eye Contact:  Fair  Speech:  Clear and Coherent  Volume:  Normal  Mood:  Anxious  Affect:  Congruent  Thought Process:  Coherent  Orientation:  Full (Time, Place, and Person)  Thought Content:  WDL  Suicidal Thoughts:  No  Homicidal Thoughts:  No  Memory:  Immediate;   Fair  Judgement:  Fair  Insight:  Fair  Psychomotor Activity:  Normal  Concentration:  Fair  Recall:  [employer] of Knowledge: Fair  Language: Fair  Akathisia:  No  Handed:  Right  AIMS (if indicated):  none  Assets:  Communication Skills Desire for Improvement Social Support  ADL's:  Intact  Cognition: WNL  Sleep:  7   Is the patient at risk to self?  No. Has the patient been a risk to self in the past 6 months?  No. Has the patient been a risk to self within the distant past?  No. Is the patient a risk to others?  No.  Has the patient been a risk to others in the past 6 months?  No. Has the patient  been a risk to others within the distant past?  No.  Current Medications: Current Outpatient Prescriptions  Medication Sig Dispense Refill  . ALPRAZolam (XANAX) 0.5 MG tablet Take 1 tablet (0.5 mg total) by mouth at bedtime as needed for anxiety. 30 tablet 0  . amLODipine (NORVASC) 2.5 MG tablet Take by mouth. Reported on 01/21/2015    . cetirizine (ZYRTEC) 10 MG tablet take 1 tablet by mouth at bedtime for allergies  0  . FLUoxetine (PROZAC) 10 MG capsule Take 1 capsule (10 mg total) by mouth daily. 90 capsule 1  . lamoTRIgine (LAMICTAL) 25 MG tablet Take 1 tablet (25 mg total) by mouth daily. 30 tablet 2  . losartan-hydrochlorothiazide (HYZAAR) 100-25 MG per tablet   0  . tretinoin (RETIN-A) 0.025 % cream apply to affected area at bedtime  0   No current facility-administered medications for this visit.    Medical Decision Making:  Review of Psycho-Social Stressors (1) and Established Problem, Worsening (2)  Treatment Plan Summary:Medication management   Depression Discussed with patient what the medications.  I will start her on Effexor XR 75 mg in the morning. I will also advised patient to continue taking Xanax 0.5 mg in the evening I will prescribe her Vistaril 25 mg by mouth twice a day when necessary for her anxiety symptoms. Discussed with her about the medications in detail and she demonstrated understanding.  She will follow-up in one month or earlier depending on her symptoms   This note was generated in part or whole with voice recognition software. Voice regonition is usually quite accurate but there are transcription errors that can and very often do occur. I apologize for any typographical errors that were not detected and corrected.   Rainey Pines, MD  07/08/2015, 10:13 AM

## 2015-07-09 ENCOUNTER — Other Ambulatory Visit: Payer: Self-pay

## 2015-07-09 ENCOUNTER — Telehealth: Payer: Self-pay

## 2015-07-09 NOTE — Telephone Encounter (Signed)
pt states that her medication have changed they had to put her on a fluid pill and the doctor had concerns about the effexor.   Pt med list was updated. Pt is started on  Losartan and chlorthalidone and the doctor had concerns about the effexor being taking with fluid pills  Wants to make sure it ok to take

## 2015-07-13 NOTE — Telephone Encounter (Signed)
Ask her to discuss with cardiologist and make sure it is okay to take it.

## 2015-07-13 NOTE — Telephone Encounter (Signed)
left message to consult with cardio dr. if she has any issues or they advise not to be on the effexor then make a f/u appt to discuss other opt.

## 2015-08-04 ENCOUNTER — Ambulatory Visit: Payer: Medicaid Other | Admitting: Psychiatry

## 2015-08-10 ENCOUNTER — Other Ambulatory Visit: Payer: Self-pay | Admitting: Psychiatry

## 2015-08-11 ENCOUNTER — Encounter: Payer: Self-pay | Admitting: Psychiatry

## 2015-08-11 ENCOUNTER — Ambulatory Visit (INDEPENDENT_AMBULATORY_CARE_PROVIDER_SITE_OTHER): Payer: Medicare Other | Admitting: Psychiatry

## 2015-08-11 VITALS — BP 133/81 | HR 85 | Temp 97.8°F | Ht 62.0 in | Wt 201.0 lb

## 2015-08-11 DIAGNOSIS — F4001 Agoraphobia with panic disorder: Secondary | ICD-10-CM | POA: Diagnosis not present

## 2015-08-11 MED ORDER — ALPRAZOLAM 0.5 MG PO TABS: 0.5000 mg | ORAL_TABLET | Freq: Every day | ORAL | 0 refills | Status: DC

## 2015-08-11 MED ORDER — HYDROXYZINE PAMOATE 25 MG PO CAPS: 25.0000 mg | ORAL_CAPSULE | Freq: Two times a day (BID) | ORAL | 1 refills | Status: DC | PRN

## 2015-08-11 MED ORDER — VENLAFAXINE HCL ER 75 MG PO CP24: 75.0000 mg | ORAL_CAPSULE | Freq: Every day | ORAL | 0 refills | Status: DC

## 2015-08-11 NOTE — Progress Notes (Signed)
BH MD/PA/NP OP Progress Note  08/11/2015 2:00 PM [place] K White  MRN:  [id number]  Subjective:  Patient is a 30 year old female who presented for the follow-up appointment. She missed her 2 children. Patient was excited as she got married on Friday and has been living with her husband for the past 10 years. She stated that she has been feeling better and has not started taking the venlafaxine as the pharmacist made her anxious. Patient reported that she has picked up the prescription and is willing to restart the medication now. Patient currently denied having any worsening of her anxiety symptoms. She continues to have pressured speech as usual. She reported that her anxiety symptoms are under control and she takes Xanax on a when necessary basis. She currently denied having any suicidal ideation or plans. 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 She appeared more calm and collective during the interview. She denied having any suicidal homicidal ideations or plans.   Chief Complaint:  Chief Complaint    Follow-up; Medication Refill     Visit Diagnosis:     ICD-9-CM ICD-10-CM   1. 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 Anxiety   . Bipolar disorder ([place])   . Depression     Past Surgical History:  Procedure Laterality Date  . HERNIA REPAIR    . TUBAL LIGATION     Family History:  Family History  Problem Relation Age of Onset  . Alcohol abuse Mother   . Drug abuse Mother   . Bipolar disorder Mother   . Schizophrenia Father   . Alcohol abuse Father   . Drug abuse Father    Social History:  Social History   Social History  . Marital status: Single    Spouse name: N/A  . Number of children: N/A  . Years of education: N/A   Social History Main Topics  . Smoking status: Never Smoker  . Smokeless tobacco: Never Used  . Alcohol use No   . Drug use: No     Comment: quit using marijuana 3 years ago  . Sexual activity: Yes    Birth control/ protection: None   Other Topics Concern  . None   Social History Narrative  . None   Additional History:  She just bought a house in [place] with her husband and spending time with her kids. She has been working for 2 days only. She is on disability and stated that she wants to work in [place].   Assessment:   Musculoskeletal: Strength & Muscle Tone: within normal limits Gait & Station: normal Patient leans: N/A  Psychiatric Specialty Exam: HPI  Review of Systems  Psychiatric/Behavioral: Positive for depression. The patient is nervous/anxious.   All other systems reviewed and are negative.   Blood pressure 133/81, pulse 85, temperature 97.8 F (36.6 C), temperature source Oral, height 5\' 2"  (1.575 m), weight 201 lb (91.2 kg), last menstrual period 07/15/2015.Body mass index is 36.76 kg/m.  General Appearance: Casual  Eye Contact:  Fair  Speech:  Clear and Coherent  Volume:  Normal  Mood:  Anxious  Affect:  Congruent  Thought Process:  Coherent  Orientation:  Full (Time, Place, and Person)  Thought Content:  WDL  Suicidal Thoughts:  No  Homicidal Thoughts:  No  Memory:  Immediate;   Fair  Judgement:  Fair  Insight:  Fair  Psychomotor Activity:  Normal  Concentration:  Fair  Recall:  [employer] of Knowledge: Fair  Language: Fair  Akathisia:  No  Handed:  Right  AIMS (if indicated):  none  Assets:  Communication Skills Desire for Improvement Social Support  ADL's:  Intact  Cognition: WNL  Sleep:  7   Is the patient at risk to self?  No. Has the patient been a risk to self in the past 6 months?  No. Has the patient been a risk to self within the distant past?  No. Is the patient a risk to others?  No.  Has the patient been a risk to others in the past 6 months?  No. Has the patient been a risk to others within the distant past?  No.  Current  Medications: Current Outpatient Prescriptions  Medication Sig Dispense Refill  . ALPRAZolam (XANAX) 0.5 MG tablet Take 1 tablet (0.5 mg total) by mouth daily with supper. 30 tablet 0  . cetirizine (ZYRTEC) 10 MG tablet take 1 tablet by mouth at bedtime for allergies  0  . chlorthalidone (HYGROTON) 25 MG tablet Take 25 mg by mouth.  0  . hydrOXYzine (VISTARIL) 25 MG capsule take 1 capsule by mouth twice a day if needed 60 capsule 0  . losartan (COZAAR) 100 MG tablet   0  . NYAMYC powder apply to affected area TO INNER THIGHS twice a day for 4 weeks  0  . tretinoin (RETIN-A) 0.025 % cream apply to affected area at bedtime  0   No current facility-administered medications for this visit.     Medical Decision Making:  Review of Psycho-Social Stressors (1) and Established Problem, Worsening (2)  Treatment Plan Summary:Medication management   Depression Discussed with patient what the medications.  I will start her on Effexor XR 75 mg in the morning. I will also advised patient to continue taking Xanax 0.5 mg in the evening I will prescribe her Vistaril 25 mg by mouth twice a day when necessary for her anxiety symptoms. Discussed with her about the medications in detail and she demonstrated understanding.  She will follow-up in 2 month or earlier depending on her symptoms   This note was generated in part or whole with voice recognition software. Voice regonition is usually quite accurate but there are transcription errors that can and very often do occur. I apologize for any typographical errors that were not detected and corrected.   Rainey Pines, MD  08/11/2015, 2:00 PM

## 2015-10-09 ENCOUNTER — Other Ambulatory Visit: Payer: Self-pay | Admitting: Psychiatry

## 2015-10-12 ENCOUNTER — Encounter: Payer: Self-pay | Admitting: Psychiatry

## 2015-10-12 ENCOUNTER — Ambulatory Visit (INDEPENDENT_AMBULATORY_CARE_PROVIDER_SITE_OTHER): Payer: Medicare Other | Admitting: Psychiatry

## 2015-10-12 VITALS — BP 146/82 | HR 70 | Temp 97.6°F | Wt 201.4 lb

## 2015-10-12 DIAGNOSIS — F4001 Agoraphobia with panic disorder: Secondary | ICD-10-CM | POA: Diagnosis not present

## 2015-10-12 MED ORDER — VENLAFAXINE HCL ER 75 MG PO CP24: 75.0000 mg | ORAL_CAPSULE | Freq: Every day | ORAL | 0 refills | Status: DC

## 2015-10-12 MED ORDER — HYDROXYZINE PAMOATE 25 MG PO CAPS: 25.0000 mg | ORAL_CAPSULE | Freq: Two times a day (BID) | ORAL | 1 refills | Status: DC | PRN

## 2015-10-12 MED ORDER — ALPRAZOLAM 0.5 MG PO TABS: 0.5000 mg | ORAL_TABLET | Freq: Every day | ORAL | 1 refills | Status: DC

## 2015-10-12 NOTE — Progress Notes (Signed)
BH MD/PA/NP OP Progress Note  10/12/2015 2:11 PM [place] K White  MRN:  [id number]  Subjective:  Patient is a 30 year old female who presented for the follow-up appointment. Accompanied by her son. She reported that she has been doing well and is waiting to get a job. She reported that she feels that the medications are helping her and she has occasional panic attacks. She tries to control them by herself. Occasionally she will take the Xanax and other times she will just deal with them. She feels that the medications are helping her. She does not want to change her medications at this time.  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 Patient reported that she is learning a lot about the billing at this time. She was noted to be taking care of her son during the interview.  She appeared more calm and collective during the interview. She denied having any suicidal homicidal ideations or plans.   Chief Complaint:  Chief Complaint    Follow-up; Medication Refill     Visit Diagnosis:     ICD-9-CM ICD-10-CM   1. 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 Anxiety   . Bipolar disorder ([place])   . Depression     Past Surgical History:  Procedure Laterality Date  . HERNIA REPAIR    . TUBAL LIGATION     Family History:  Family History  Problem Relation Age of Onset  . Alcohol abuse Mother   . Drug abuse Mother   . Bipolar disorder Mother   . Schizophrenia Father   . Alcohol abuse Father   . Drug abuse Father    Social History:  Social History   Social History  . Marital status: Single    Spouse name: N/A  . Number of children: N/A  . Years of education: N/A   Social History Main Topics  . Smoking status: Never Smoker  . Smokeless tobacco: Never Used  . Alcohol use No  . Drug use: No     Comment: quit using marijuana 3 years ago  .  Sexual activity: Yes    Birth control/ protection: None   Other Topics Concern  . None   Social History Narrative  . None   Additional History:  She just bought a house in [place] with her husband and spending time with her kids. She has been working for 2 days only. She is on disability and stated that she wants to work in [place].   Assessment:   Musculoskeletal: Strength & Muscle Tone: within normal limits Gait & Station: normal Patient leans: N/A  Psychiatric Specialty Exam: Medication Refill     Review of Systems  Psychiatric/Behavioral: Positive for depression. The patient is nervous/anxious.   All other systems reviewed and are negative.   Blood pressure (!) 146/82, pulse 70, temperature 97.6 F (36.4 C), temperature source Oral, weight 201 lb 6.4 oz (91.4 kg).Body mass index is 36.84 kg/m.  General Appearance: Casual  Eye Contact:  Fair  Speech:  Clear and Coherent  Volume:  Normal  Mood:  Anxious  Affect:  Congruent  Thought Process:  Coherent  Orientation:  Full (Time, Place, and Person)  Thought Content:  WDL  Suicidal Thoughts:  No  Homicidal Thoughts:  No  Memory:  Immediate;   Fair  Judgement:  Fair  Insight:  Fair  Psychomotor Activity:  Normal  Concentration:  Fair  Recall:  [employer] of Knowledge: Fair  Language: Fair  Akathisia:  No  Handed:  Right  AIMS (if indicated):  none  Assets:  Communication Skills Desire for Improvement Social Support  ADL's:  Intact  Cognition: WNL  Sleep:  7   Is the patient at risk to self?  No. Has the patient been a risk to self in the past 6 months?  No. Has the patient been a risk to self within the distant past?  No. Is the patient a risk to others?  No.  Has the patient been a risk to others in the past 6 months?  No. Has the patient been a risk to others within the distant past?  No.  Current Medications: Current Outpatient Prescriptions  Medication Sig Dispense Refill  . ALPRAZolam (XANAX) 0.5  MG tablet Take 1 tablet (0.5 mg total) by mouth daily with supper. 30 tablet 0  . cetirizine (ZYRTEC) 10 MG tablet take 1 tablet by mouth at bedtime for allergies  0  . chlorthalidone (HYGROTON) 25 MG tablet Take 25 mg by mouth.  0  . hydrOXYzine (VISTARIL) 25 MG capsule Take 1 capsule (25 mg total) by mouth 2 (two) times daily as needed. 60 capsule 1  . losartan (COZAAR) 100 MG tablet   0  . NYAMYC powder apply to affected area TO INNER THIGHS twice a day for 4 weeks  0  . tretinoin (RETIN-A) 0.025 % cream apply to affected area at bedtime  0  . venlafaxine XR (EFFEXOR-XR) 75 MG 24 hr capsule Take 1 capsule (75 mg total) by mouth daily with breakfast. 30 capsule 0   No current facility-administered medications for this visit.     Medical Decision Making:  Review of Psycho-Social Stressors (1) and Established Problem, Worsening (2)  Treatment Plan Summary:Medication management   Depression Discussed with patient what the medications.  Continue  Effexor XR 75 mg in the morning. Continue  Xanax 0.5 mg in the evening Continue Vistaril 25 mg by mouth twice a day when necessary for her anxiety symptoms. Discussed with her about the medications in detail and she demonstrated understanding.  She will follow-up in 2 month or earlier depending on her symptoms   This note was generated in part or whole with voice recognition software. Voice regonition is usually quite accurate but there are transcription errors that can and very often do occur. I apologize for any typographical errors that were not detected and corrected.   Rainey Pines, MD  10/12/2015, 2:11 PM

## 2015-11-04 ENCOUNTER — Other Ambulatory Visit: Payer: Self-pay

## 2015-11-04 ENCOUNTER — Ambulatory Visit: Payer: Self-pay | Admitting: Urology

## 2015-11-04 ENCOUNTER — Ambulatory Visit: Admission: RE | Admit: 2015-11-04 | Payer: Medicare Other | Source: Ambulatory Visit | Admitting: Urology

## 2015-11-04 DIAGNOSIS — N200 Calculus of kidney: Secondary | ICD-10-CM

## 2015-11-08 ENCOUNTER — Other Ambulatory Visit: Payer: Self-pay | Admitting: Psychiatry

## 2015-11-08 ENCOUNTER — Ambulatory Visit
Admission: RE | Admit: 2015-11-08 | Discharge: 2015-11-08 | Disposition: A | Discharge status: Home / Self Care | Payer: Medicare Other | Source: Ambulatory Visit | Attending: Urology | Admitting: Urology

## 2015-11-08 DIAGNOSIS — Z87442 Personal history of urinary calculi: Secondary | ICD-10-CM | POA: Diagnosis not present

## 2015-11-08 DIAGNOSIS — N200 Calculus of kidney: Secondary | ICD-10-CM

## 2015-11-09 ENCOUNTER — Encounter: Payer: Self-pay | Admitting: Urology

## 2015-11-09 ENCOUNTER — Ambulatory Visit (INDEPENDENT_AMBULATORY_CARE_PROVIDER_SITE_OTHER): Payer: Medicare Other | Admitting: Urology

## 2015-11-09 VITALS — BP 136/83 | HR 88 | Ht 62.0 in | Wt 197.3 lb

## 2015-11-09 DIAGNOSIS — N132 Hydronephrosis with renal and ureteral calculous obstruction: Secondary | ICD-10-CM

## 2015-11-09 DIAGNOSIS — N201 Calculus of ureter: Secondary | ICD-10-CM | POA: Diagnosis not present

## 2015-11-09 NOTE — Progress Notes (Signed)
11/09/2015 10:43 AM   Jasmine Barnes [birth year] [id number]  Referring provider: Kasandra Knudsen, NP [address] [place], [place] [address]  Chief Complaint  Patient presents with  . Follow-up    1 year kidney stone KUB done    HPI: Patient is a 30 year old African American female with a history of nephroliathiasis who presents today for a yearly follow up.    Her KUB performed on 11/08/2015 notes no radiographic evidence of a renal or ureteral stone.  I have independently reviewed the films.    CT Renal stone study performed on 03/20/2014 noted hydroureteronephrosis on the right. 5 x 10 mm stone in the distal right ureter 1 cm proximal to the UVJ, for which she underwent ESWL, and a few small nonobstructing calculi in both kidneys.   Today, she is not having any flank pain, gross hematuria or passage of fragments.  She has not had any "stone pain" since she last saw [place] over a year ago.    She did not have a follow up RUS after ESWL.     PMH: Past Medical History:  Diagnosis Date  . Anxiety   . Bipolar disorder ([place])   . Depression   . Kidney stone     Surgical History: Past Surgical History:  Procedure Laterality Date  . HERNIA REPAIR    . LITHOTRIPSY     x 2  . TUBAL LIGATION      Home Medications:    Medication List       Accurate as of 11/09/15 10:43 AM. Always use your most recent med list.          ALPRAZolam 0.5 MG tablet Commonly known as:  XANAX Take 1 tablet (0.5 mg total) by mouth daily with supper.   cetirizine 10 MG tablet Commonly known as:  ZYRTEC take 1 tablet by mouth at bedtime for allergies   chlorthalidone 25 MG tablet Commonly known as:  HYGROTON Take 25 mg by mouth.   hydrOXYzine 25 MG capsule Commonly known as:  VISTARIL Take 1 capsule (25 mg total) by mouth 2 (two) times daily as needed.   losartan 100 MG tablet Commonly known as:  COZAAR   venlafaxine XR 75 MG 24 hr capsule Commonly known as:  EFFEXOR-XR Take 1  capsule (75 mg total) by mouth daily with breakfast.       Allergies:  Allergies  Allergen Reactions  . Penicillin G Anaphylaxis    Family History: Family History  Problem Relation Age of Onset  . Alcohol abuse Mother   . Drug abuse Mother   . Bipolar disorder Mother   . Schizophrenia Father   . Alcohol abuse Father   . Drug abuse Father   . Kidney disease Neg Hx   . Bladder Cancer Neg Hx     Social History:  reports that she has never smoked. She has never used smokeless tobacco. She reports that she drinks alcohol. She reports that she does not use drugs.  ROS: UROLOGY Frequent Urination?: No Hard to postpone urination?: No Burning/pain with urination?: No Get up at night to urinate?: No Leakage of urine?: No Urine stream starts and stops?: No Trouble starting stream?: No Do you have to strain to urinate?: No Blood in urine?: No Urinary tract infection?: No Sexually transmitted disease?: No Injury to kidneys or bladder?: No Painful intercourse?: No Weak stream?: No Currently pregnant?: No Vaginal bleeding?: No Last menstrual period?: 11/01/2015  Gastrointestinal Nausea?: No Vomiting?: No Indigestion/heartburn?:  No Diarrhea?: No Constipation?: No  Constitutional Fever: No Night sweats?: No Weight loss?: No Fatigue?: No  Skin Skin rash/lesions?: No Itching?: No  Eyes Blurred vision?: No Double vision?: No  Ears/Nose/Throat Sore throat?: No Sinus problems?: No  Hematologic/Lymphatic Swollen glands?: No Easy bruising?: No  Cardiovascular Leg swelling?: No Chest pain?: No  Respiratory Cough?: No Shortness of breath?: No  Endocrine Excessive thirst?: No  Musculoskeletal Back pain?: No Joint pain?: No  Neurological Headaches?: Yes Dizziness?: No  Psychologic Depression?: No Anxiety?: Yes  Physical Exam: BP 136/83   Pulse 88   Ht 5\' 2"  (1.575 m)   Wt 197 lb 4.8 oz (89.5 kg)   LMP 11/01/2015   BMI 36.09 kg/m     Constitutional: Well nourished. Alert and oriented, No acute distress. HEENT: [place] AT, moist mucus membranes. Trachea midline, no masses. Cardiovascular: No clubbing, cyanosis, or edema. Respiratory: Normal respiratory effort, no increased work of breathing. GI: Abdomen is soft, non tender, non distended, no abdominal masses. Liver and spleen not palpable.  No hernias appreciated.  Stool sample for occult testing is not indicated.   GU: No CVA tenderness.  No bladder fullness or masses.   Skin: No rashes, bruises or suspicious lesions. Lymph: No cervical or inguinal adenopathy. Neurologic: Grossly intact, no focal deficits, moving all 4 extremities. Psychiatric: Normal mood and affect.  Laboratory Data: Lab Results  Component Value Date   WBC 8.0 05/22/2012   HGB 13.8 09/27/2012   HCT 37.0 05/22/2012   MCV 86 05/22/2012   PLT 312 05/22/2012    Lab Results  Component Value Date   CREATININE 0.65 09/27/2012    Lab Results  Component Value Date   AST 30 09/27/2012   Lab Results  Component Value Date   ALT 32 09/27/2012      Pertinent Imaging: CLINICAL DATA:  Pt states this is a follow up xray. She had kidney stones last year and had to have litho twice, believes stones were on the right side. No complaints at this time. shielded  EXAM: ABDOMEN - 1 VIEW  COMPARISON:  04/06/2014  FINDINGS: No radiographic evidence of renal or ureteral stones. Normal bowel gas pattern. Small stable bone island in the right ilium.  IMPRESSION: Negative.  No radiographic evidence of a renal or ureteral stone.   Electronically Signed   By: Lajean Manes M.D.   On: 11/08/2015 15:44   Assessment & Plan:    1. Right ureteral stone  - s/p R ESWL in 2016  2. Right hydronephrosis  - obtain RUS to ensure the hydronephrosis has resolved.    - I will call with results   Return for I will call patient with results.  These notes generated with voice recognition software. I  apologize for typographical errors.  Zara Council, [facility] [address] [place], [place] [address] [phone]

## 2015-12-08 ENCOUNTER — Other Ambulatory Visit: Payer: Self-pay | Admitting: Psychiatry

## 2015-12-08 MED ORDER — VENLAFAXINE HCL ER 75 MG PO CP24: 75.0000 mg | ORAL_CAPSULE | Freq: Every day | ORAL | 0 refills | Status: DC

## 2015-12-24 ENCOUNTER — Ambulatory Visit (INDEPENDENT_AMBULATORY_CARE_PROVIDER_SITE_OTHER): Payer: Medicare Other | Admitting: Psychiatry

## 2015-12-24 ENCOUNTER — Encounter: Payer: Self-pay | Admitting: Psychiatry

## 2015-12-24 VITALS — BP 134/79 | HR 83 | Temp 98.5°F | Wt 199.2 lb

## 2015-12-24 DIAGNOSIS — F4001 Agoraphobia with panic disorder: Secondary | ICD-10-CM

## 2015-12-24 MED ORDER — VENLAFAXINE HCL ER 75 MG PO CP24: 75.0000 mg | ORAL_CAPSULE | Freq: Every day | ORAL | 1 refills | Status: DC

## 2015-12-24 MED ORDER — HYDROXYZINE PAMOATE 25 MG PO CAPS: 25.0000 mg | ORAL_CAPSULE | Freq: Two times a day (BID) | ORAL | 1 refills | Status: DC | PRN

## 2015-12-24 MED ORDER — ALPRAZOLAM 0.5 MG PO TABS: 0.5000 mg | ORAL_TABLET | Freq: Every day | ORAL | 1 refills | Status: DC

## 2015-12-24 NOTE — Progress Notes (Signed)
BH MD/PA/NP OP Progress Note  12/24/2015 11:38 AM Jasmine Barnes  MRN:  [id number]  Subjective:  Patient is a 30 year old female who presented for the follow-up appointment.  She reported that she is stressed out due to her final exams this week. She reported that she will be done by the end of this week. She reported that she continues to have anxiety symptoms and has physical complaints. She reported that she does not want to change her medications at this time. Patient reported that she has been compliant with her medications. She sleeps well. However she is interested in having her medications adjusted after she is done with her finals. She stated that she is going on a trip in April. She denied having any suicidal ideations or plans. She denied having any perceptual disturbances.  .  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 Patient reported that she is learning a lot about the billing at this time.   Chief Complaint:  Chief Complaint    Follow-up; Medication Refill     Visit Diagnosis:     ICD-9-CM ICD-10-CM   1. 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 Anxiety   . Bipolar disorder ([place])   . Depression   . Kidney stone     Past Surgical History:  Procedure Laterality Date  . HERNIA REPAIR    . LITHOTRIPSY     x 2  . TUBAL LIGATION     Family History:  Family History  Problem Relation Age of Onset  . Alcohol abuse Mother   . Drug abuse Mother   . Bipolar disorder Mother   . Schizophrenia Father   . Alcohol abuse Father   . Drug abuse Father   . Kidney disease Neg Hx   . Bladder Cancer Neg Hx    Social History:  Social History   Social History  . Marital status: Single    Spouse name: N/A  . Number of children: N/A  . Years of education: N/A   Social History Main Topics  . Smoking status: Never Smoker  . Smokeless  tobacco: Never Used  . Alcohol use 0.0 oz/week     Comment: occ  . Drug use: No     Comment: quit using marijuana 3 years ago  . Sexual activity: Yes    Birth control/ protection: None   Other Topics Concern  . None   Social History Narrative  . None   Additional History:  She just bought a house in [place] with her husband and spending time with her kids. She has been working for 2 days only. She is on disability and stated that she wants to work in [place].   Assessment:   Musculoskeletal: Strength & Muscle Tone: within normal limits Gait & Station: normal Patient leans: N/A  Psychiatric Specialty Exam: Medication Refill     Review of Systems  Psychiatric/Behavioral: Positive for depression. The patient is nervous/anxious.   All other systems reviewed and are negative.   Blood pressure 134/79, pulse 83, temperature 98.5 F (36.9 C), temperature source Oral, weight 199 lb 3.2 oz (90.4 kg).Body mass index is 36.43 kg/m.  General Appearance: Casual  Eye Contact:  Fair  Speech:  Clear and Coherent  Volume:  Normal  Mood:  Anxious  Affect:  Congruent  Thought Process:  Coherent  Orientation:  Full (Time, Place, and Person)  Thought Content:  WDL  Suicidal Thoughts:  No  Homicidal Thoughts:  No  Memory:  Immediate;   Fair  Judgement:  Fair  Insight:  Fair  Psychomotor Activity:  Normal  Concentration:  Fair  Recall:  [employer] of Knowledge: Fair  Language: Fair  Akathisia:  No  Handed:  Right  AIMS (if indicated):  none  Assets:  Communication Skills Desire for Improvement Social Support  ADL's:  Intact  Cognition: WNL  Sleep:  7   Is the patient at risk to self?  No. Has the patient been a risk to self in the past 6 months?  No. Has the patient been a risk to self within the distant past?  No. Is the patient a risk to others?  No.  Has the patient been a risk to others in the past 6 months?  No. Has the patient been a risk to others within the distant  past?  No.  Current Medications: Current Outpatient Prescriptions  Medication Sig Dispense Refill  . ALPRAZolam (XANAX) 0.5 MG tablet Take 1 tablet (0.5 mg total) by mouth daily with supper. 30 tablet 1  . cetirizine (ZYRTEC) 10 MG tablet take 1 tablet by mouth at bedtime for allergies  0  . chlorthalidone (HYGROTON) 25 MG tablet Take 25 mg by mouth.  0  . hydrOXYzine (VISTARIL) 25 MG capsule Take 1 capsule (25 mg total) by mouth 2 (two) times daily as needed. 60 capsule 1  . losartan (COZAAR) 100 MG tablet   0  . venlafaxine XR (EFFEXOR-XR) 75 MG 24 hr capsule Take 1 capsule (75 mg total) by mouth daily with breakfast. 30 capsule 1   No current facility-administered medications for this visit.     Medical Decision Making:  Review of Psycho-Social Stressors (1) and Established Problem, Worsening (2)  Treatment Plan Summary:Medication management   Depression Discussed with patient what the medications.  Continue  Effexor XR 75 mg in the morning. Continue  Xanax 0.5 mg in the evening Continue Vistaril 25 mg by mouth twice a day when necessary for her anxiety symptoms. Discussed with her about the medications in detail and she demonstrated understanding.  She will follow-up in 2 weeks  or earlier depending on her symptoms   This note was generated in part or whole with voice recognition software. Voice regonition is usually quite accurate but there are transcription errors that can and very often do occur. I apologize for any typographical errors that were not detected and corrected.   Rainey Pines, MD  12/24/2015, 11:38 AM

## 2016-01-05 ENCOUNTER — Ambulatory Visit: Payer: Self-pay | Admitting: Psychiatry

## 2016-01-06 ENCOUNTER — Ambulatory Visit: Payer: Medicare Other | Admitting: Psychiatry

## 2016-01-26 ENCOUNTER — Ambulatory Visit (INDEPENDENT_AMBULATORY_CARE_PROVIDER_SITE_OTHER): Payer: Medicare Other | Admitting: Psychiatry

## 2016-01-26 ENCOUNTER — Encounter: Payer: Self-pay | Admitting: Psychiatry

## 2016-01-26 ENCOUNTER — Ambulatory Visit: Payer: Medicare Other | Admitting: Psychiatry

## 2016-01-26 VITALS — BP 119/78 | HR 88 | Temp 98.5°F | Wt 196.2 lb

## 2016-01-26 DIAGNOSIS — F4001 Agoraphobia with panic disorder: Secondary | ICD-10-CM

## 2016-01-26 MED ORDER — VENLAFAXINE HCL ER 75 MG PO CP24: 75.0000 mg | ORAL_CAPSULE | Freq: Two times a day (BID) | ORAL | 1 refills | Status: DC

## 2016-01-26 MED ORDER — ALPRAZOLAM 0.5 MG PO TABS: 0.5000 mg | ORAL_TABLET | Freq: Every day | ORAL | 1 refills | Status: DC

## 2016-01-26 MED ORDER — HYDROXYZINE PAMOATE 25 MG PO CAPS: 25.0000 mg | ORAL_CAPSULE | Freq: Two times a day (BID) | ORAL | 1 refills | Status: DC | PRN

## 2016-01-26 MED ORDER — GABAPENTIN 100 MG PO CAPS: 100.0000 mg | ORAL_CAPSULE | Freq: Every day | ORAL | 1 refills | Status: DC

## 2016-01-26 NOTE — Progress Notes (Signed)
BH MD/PA/NP OP Progress Note  01/26/2016 10:04 AM Jasmine Barnes  MRN:  [id number]  Subjective:  Patient is a 31 year old female who presented for the follow-up appointment.  She reported that she is excited as she has a job interview on Friday. She appeared tired this morning. She reported that she did not sleep well last night. Patient reported that she has been working for a long period of time and she is very happy that she is finally able to apply for a job and has a job interview. She was talking at length about the same. She reported that she had a panic attack yesterday and we discussed about adjusting her medications. She reported that she has been med compliant. She currently denied having any suicidal homicidal ideations or plans. She denied having any perceptual disturbances. She reported that she is looking forward to start her job.   Chief Complaint:  Chief Complaint    Follow-up; Medication Refill     Visit Diagnosis:     ICD-9-CM ICD-10-CM   1. 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 Anxiety   . Bipolar disorder ([place])   . Depression   . Kidney stone     Past Surgical History:  Procedure Laterality Date  . HERNIA REPAIR    . LITHOTRIPSY     x 2  . TUBAL LIGATION     Family History:  Family History  Problem Relation Age of Onset  . Alcohol abuse Mother   . Drug abuse Mother   . Bipolar disorder Mother   . Schizophrenia Father   . Alcohol abuse Father   . Drug abuse Father   . Kidney disease Neg Hx   . Bladder Cancer Neg Hx    Social History:  Social History   Social History  . Marital status: Single    Spouse name: N/A  . Number of children: N/A  . Years of education: N/A   Social History Main Topics  . Smoking status: Never Smoker  . Smokeless tobacco: Never Used  . Alcohol use 0.0 oz/week     Comment: occ  . Drug use: No     Comment: quit using  marijuana 3 years ago  . Sexual activity: Yes    Birth control/ protection: None   Other Topics Concern  . None   Social History Narrative  . None   Additional History:  She just bought a house in [place] with her husband and spending time with her kids. She has been working for 2 days only. She is on disability and stated that she wants to work in [place].   Assessment:   Musculoskeletal: Strength & Muscle Tone: within normal limits Gait & Station: normal Patient leans: N/A  Psychiatric Specialty Exam: Medication Refill     Review of Systems  Psychiatric/Behavioral: Positive for depression. The patient is nervous/anxious.   All other systems reviewed and are negative.   Blood pressure 119/78, pulse 88, temperature 98.5 F (36.9 C), temperature source Oral, weight 196 lb 3.2 oz (89 kg).Body mass index is 35.89 kg/m.  General Appearance: Casual  Eye Contact:  Fair  Speech:  Clear and Coherent  Volume:  Normal  Mood:  Anxious  Affect:  Congruent  Thought Process:  Coherent  Orientation:  Full (Time, Place, and Person)  Thought Content:  WDL  Suicidal Thoughts:  No  Homicidal Thoughts:  No  Memory:  Immediate;   Fair  Judgement:  Fair  Insight:  Fair  Psychomotor Activity:  Normal  Concentration:  Fair  Recall:  [employer] of Knowledge: Fair  Language: Fair  Akathisia:  No  Handed:  Right  AIMS (if indicated):  none  Assets:  Communication Skills Desire for Improvement Social Support  ADL's:  Intact  Cognition: WNL  Sleep:  7   Is the patient at risk to self?  No. Has the patient been a risk to self in the past 6 months?  No. Has the patient been a risk to self within the distant past?  No. Is the patient a risk to others?  No.  Has the patient been a risk to others in the past 6 months?  No. Has the patient been a risk to others within the distant past?  No.  Current Medications: Current Outpatient Prescriptions  Medication Sig Dispense Refill  .  ALPRAZolam (XANAX) 0.5 MG tablet Take 1 tablet (0.5 mg total) by mouth daily with supper. 30 tablet 1  . cetirizine (ZYRTEC) 10 MG tablet take 1 tablet by mouth at bedtime for allergies  0  . chlorthalidone (HYGROTON) 25 MG tablet Take 25 mg by mouth.  0  . hydrOXYzine (VISTARIL) 25 MG capsule Take 1 capsule (25 mg total) by mouth 2 (two) times daily as needed. 60 capsule 1  . losartan (COZAAR) 100 MG tablet   0  . venlafaxine XR (EFFEXOR-XR) 75 MG 24 hr capsule Take 1 capsule (75 mg total) by mouth 2 (two) times daily. 60 capsule 1  . gabapentin (NEURONTIN) 100 MG capsule Take 1 capsule (100 mg total) by mouth at bedtime. 30 capsule 1   No current facility-administered medications for this visit.     Medical Decision Making:  Review of Psycho-Social Stressors (1) and Established Problem, Worsening (2)  Treatment Plan Summary:Medication management   Depression Discussed with patient what the medications.  Continue  Effexor XR 75 mg in the morning.I will add Effexor XR 75 mg when necessary for anxiety. Start Neurontin 100 mg when necessary for panic attacks. Continue  Xanax 0.5 mg in the evening Continue Vistaril 25 mg by mouth twice a day when necessary for her anxiety symptoms. Discussed with her about the medications in detail and she demonstrated understanding.  She will follow-up in 4 weeks  or earlier depending on her symptoms   This note was generated in part or whole with voice recognition software. Voice regonition is usually quite accurate but there are transcription errors that can and very often do occur. I apologize for any typographical errors that were not detected and corrected.   Rainey Pines, MD  01/26/2016, 10:04 AM

## 2016-02-25 DIAGNOSIS — E559 Vitamin D deficiency, unspecified: Secondary | ICD-10-CM | POA: Diagnosis not present

## 2016-02-25 DIAGNOSIS — F410 Panic disorder [episodic paroxysmal anxiety] without agoraphobia: Secondary | ICD-10-CM | POA: Diagnosis not present

## 2016-02-25 DIAGNOSIS — I10 Essential (primary) hypertension: Secondary | ICD-10-CM | POA: Diagnosis not present

## 2016-02-25 DIAGNOSIS — E663 Overweight: Secondary | ICD-10-CM | POA: Diagnosis not present

## 2016-02-25 DIAGNOSIS — R5382 Chronic fatigue, unspecified: Secondary | ICD-10-CM | POA: Diagnosis not present

## 2016-02-25 DIAGNOSIS — M70822 Other soft tissue disorders related to use, overuse and pressure, left upper arm: Secondary | ICD-10-CM | POA: Diagnosis not present

## 2016-02-25 DIAGNOSIS — F419 Anxiety disorder, unspecified: Secondary | ICD-10-CM | POA: Diagnosis not present

## 2016-02-25 DIAGNOSIS — E669 Obesity, unspecified: Secondary | ICD-10-CM | POA: Diagnosis not present

## 2016-03-20 DIAGNOSIS — N920 Excessive and frequent menstruation with regular cycle: Secondary | ICD-10-CM | POA: Diagnosis not present

## 2016-03-20 DIAGNOSIS — I10 Essential (primary) hypertension: Secondary | ICD-10-CM | POA: Diagnosis not present

## 2016-03-20 DIAGNOSIS — F419 Anxiety disorder, unspecified: Secondary | ICD-10-CM | POA: Diagnosis not present

## 2016-03-20 DIAGNOSIS — E785 Hyperlipidemia, unspecified: Secondary | ICD-10-CM | POA: Diagnosis not present

## 2016-03-20 DIAGNOSIS — E559 Vitamin D deficiency, unspecified: Secondary | ICD-10-CM | POA: Diagnosis not present

## 2016-04-03 ENCOUNTER — Ambulatory Visit: Payer: Medicare Other | Admitting: Psychiatry

## 2016-05-08 ENCOUNTER — Encounter: Payer: Self-pay | Admitting: Psychiatry

## 2016-05-08 ENCOUNTER — Ambulatory Visit (INDEPENDENT_AMBULATORY_CARE_PROVIDER_SITE_OTHER): Payer: Medicare Other | Admitting: Psychiatry

## 2016-05-08 VITALS — BP 148/85 | HR 73 | Temp 97.8°F | Wt 201.2 lb

## 2016-05-08 DIAGNOSIS — F4001 Agoraphobia with panic disorder: Secondary | ICD-10-CM | POA: Diagnosis not present

## 2016-05-08 MED ORDER — HYDROXYZINE PAMOATE 25 MG PO CAPS: 25.0000 mg | ORAL_CAPSULE | Freq: Two times a day (BID) | ORAL | 1 refills | Status: DC | PRN

## 2016-05-08 MED ORDER — VENLAFAXINE HCL ER 75 MG PO CP24: 75.0000 mg | ORAL_CAPSULE | Freq: Two times a day (BID) | ORAL | 1 refills | Status: DC

## 2016-05-08 NOTE — Progress Notes (Signed)
BH MD/PA/NP OP Progress Note  05/08/2016 2:53 PM Jasmine Barnes  MRN:  [id number]  Subjective:  Patient is a 31 year old female who presented for the follow-up appointment.  She was very excited as she reported that she has  found a job in [employer] and will be starting next month. She stated that she feels that her anxiety is improving. She hasn't spoken to [place] her PA at the primary care office. Patient was focused on getting a prescription for the stimulant medication as she feels that she has ADD symptoms. I discussed with her that she does not have any ADD symptoms. I also reviewed her records in detail.  She reported that she was diagnosed with mood symptoms since she was 31 years old and was given Thorazine and lithium and Seroquel Depakote and Prozac gabapentin and Topamax and nortriptyline in the past. She reported that she does not want to take any mood stabilizer. She has a strong family history of bipolar and schizophrenia. Her father was on lithium in the past. She does not want to take any mood stabilizer. She continues to have pressured speech and racing thoughts. However she wants to minimize her mood symptoms and is focused on getting ADD medications. She does not meet the criteria for ADD medications at this time.  She reported that her anxiety is improving and she wants to continue her medications. She also wants to stop the nortriptyline at this time. She denied having any suicidal homicidal ideations or plans. She denied having any perceptual disturbances.  She reported that she goes to the work as his and is exercising regularly. She is also planning to attend [facility]    Chief Complaint:  Chief Complaint    Medication Refill; Follow-up     Visit Diagnosis:     ICD-9-CM ICD-10-CM   1. 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 Anxiety   . Bipolar disorder  ([place])   . Depression   . Kidney stone     Past Surgical History:  Procedure Laterality Date  . HERNIA REPAIR    . LITHOTRIPSY     x 2  . TUBAL LIGATION     Family History:  Family History  Problem Relation Age of Onset  . Alcohol abuse Mother   . Drug abuse Mother   . Bipolar disorder Mother   . Schizophrenia Father   . Alcohol abuse Father   . Drug abuse Father   . Kidney disease Neg Hx   . Bladder Cancer Neg Hx    Social History:  Social History   Social History  . Marital status: Single    Spouse name: N/A  . Number of children: N/A  . Years of education: N/A   Social History Main Topics  . Smoking status: Never Smoker  . Smokeless tobacco: Never Used  . Alcohol use 0.0 oz/week     Comment: occ  . Drug use: No     Comment: quit using marijuana 3 years ago  . Sexual activity: Yes    Birth control/ protection: None   Other Topics Concern  . None   Social History Narrative  . None   Additional History:  She just bought a house in [place] with her husband and spending time with her kids. She has been working for 2 days only. She is on disability and stated that she wants  to work in [place].   Assessment:   Musculoskeletal: Strength & Muscle Tone: within normal limits Gait & Station: normal Patient leans: N/A  Psychiatric Specialty Exam: Medication Refill     Review of Systems  Psychiatric/Behavioral: Positive for depression. The patient is nervous/anxious.   All other systems reviewed and are negative.   Blood pressure (!) 148/85, pulse 73, temperature 97.8 F (36.6 C), temperature source Oral, weight 201 lb 3.2 oz (91.3 kg), last menstrual period 04/19/2016.Body mass index is 36.8 kg/m.  General Appearance: Casual  Eye Contact:  Fair  Speech:  Clear and Coherent  Volume:  Normal  Mood:  Anxious  Affect:  Congruent  Thought Process:  Coherent  Orientation:  Full (Time, Place, and Person)  Thought Content:  WDL  Suicidal Thoughts:  No   Homicidal Thoughts:  No  Memory:  Immediate;   Fair  Judgement:  Fair  Insight:  Fair  Psychomotor Activity:  Normal  Concentration:  Fair  Recall:  [employer] of Knowledge: Fair  Language: Fair  Akathisia:  No  Handed:  Right  AIMS (if indicated):  none  Assets:  Communication Skills Desire for Improvement Social Support  ADL's:  Intact  Cognition: WNL  Sleep:  7   Is the patient at risk to self?  No. Has the patient been a risk to self in the past 6 months?  No. Has the patient been a risk to self within the distant past?  No. Is the patient a risk to others?  No.  Has the patient been a risk to others in the past 6 months?  No. Has the patient been a risk to others within the distant past?  No.  Current Medications: Current Outpatient Prescriptions  Medication Sig Dispense Refill  . ALPRAZolam (XANAX) 0.5 MG tablet Take 1 tablet (0.5 mg total) by mouth daily with supper. 30 tablet 1  . cetirizine (ZYRTEC) 10 MG tablet take 1 tablet by mouth at bedtime for allergies  0  . chlorthalidone (HYGROTON) 25 MG tablet Take 25 mg by mouth.  0  . hydrOXYzine (VISTARIL) 25 MG capsule Take 1 capsule (25 mg total) by mouth 2 (two) times daily as needed. 60 capsule 1  . hydrOXYzine (VISTARIL) 25 MG capsule Take 1 capsule (25 mg total) by mouth 2 (two) times daily as needed. 60 capsule 1  . losartan (COZAAR) 100 MG tablet   0  . venlafaxine XR (EFFEXOR-XR) 75 MG 24 hr capsule Take 1 capsule (75 mg total) by mouth 2 (two) times daily. 60 capsule 1   No current facility-administered medications for this visit.     Medical Decision Making:  Review of Psycho-Social Stressors (1) and Established Problem, Worsening (2)  Treatment Plan Summary:Medication management   Depression Discussed with patient what the medications.  Continue  Effexor XR 75 mg in the morning.I will add Effexor XR 75 mg when necessary for anxiety. D/c Neurontin . Continue  Xanax 0.5 mg in the evening Continue  Vistaril 25 mg by mouth twice a day when necessary for her anxiety symptoms. Discussed with her about the medications in detail and she demonstrated understanding.  She will follow-up in 2 months   or earlier depending on her symptoms   This note was generated in part or whole with voice recognition software. Voice regonition is usually quite accurate but there are transcription errors that can and very often do occur. I apologize for any typographical errors that were not detected and corrected.  Rainey Pines, MD  05/08/2016, 2:53 PM

## 2016-05-23 ENCOUNTER — Other Ambulatory Visit: Payer: Self-pay | Admitting: Psychiatry

## 2016-06-21 ENCOUNTER — Ambulatory Visit: Payer: Medicare Other | Admitting: Psychiatry

## 2016-07-05 ENCOUNTER — Ambulatory Visit (INDEPENDENT_AMBULATORY_CARE_PROVIDER_SITE_OTHER): Payer: Medicare Other | Admitting: Psychiatry

## 2016-07-05 ENCOUNTER — Encounter: Payer: Self-pay | Admitting: Psychiatry

## 2016-07-05 VITALS — BP 150/90 | HR 97 | Temp 98.0°F | Wt 196.8 lb

## 2016-07-05 DIAGNOSIS — F4001 Agoraphobia with panic disorder: Secondary | ICD-10-CM | POA: Diagnosis not present

## 2016-07-05 DIAGNOSIS — F3160 Bipolar disorder, current episode mixed, unspecified: Secondary | ICD-10-CM

## 2016-07-05 MED ORDER — ARIPIPRAZOLE 2 MG PO TABS
2.0000 mg | ORAL_TABLET | Freq: Every day | ORAL | 1 refills | Status: DC
Start: 2016-07-05 — End: 2016-08-28

## 2016-07-05 NOTE — Progress Notes (Signed)
BH MD/PA/NP OP Progress Note  07/05/2016 1:49 PM Jasmine Barnes  MRN:  [id number]  Subjective:  Patient is a 31 year old female who presented for the follow-up appointment.  She presented anxious apprehensive and somewhat agitated during the interview. She reported that she was unable to do a job as she is not physically and mentally ready for the same. She reported that she was evaluated in her by her cardiologist who decided that she has some cardiac issues and she is not ready for the job at this time. She reported that she is going to the [employer] [facility] and is also doing part-time job at her previous position. She reported that she continues to have racing thoughts anxiety and irritability. She reported that she has looked up medications on line and she wants something else to help with her symptoms. She already stopped all her previous psychotropic medications 2 weeks ago. She was difficult to be redirected during the interview as she was noted to have pressured speech.   Discussed with patient about starting her on Abilify and she was finally agreeable to the same. She reported that the Xanax does not help her and she does not have any history of ADD. She continues to talk to her PA at the primary care physician about her medications and they have been giving her vitamins and magnesium at this time.   Patient smells strongly of marijuana during the interview.   Chief Complaint:  Chief Complaint    Follow-up; Medication Refill; Medication Problem     Visit Diagnosis:     ICD-10-CM   1. 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 Anxiety   . Bipolar disorder ([place])   . Depression   . Kidney stone     Past Surgical History:  Procedure Laterality Date  . HERNIA REPAIR    . LITHOTRIPSY     x 2  . TUBAL LIGATION     Family History:  Family History  Problem  Relation Age of Onset  . Alcohol abuse Mother   . Drug abuse Mother   . Bipolar disorder Mother   . Schizophrenia Father   . Alcohol abuse Father   . Drug abuse Father   . Kidney disease Neg Hx   . Bladder Cancer Neg Hx    Social History:  Social History   Social History  . Marital status: Single    Spouse name: N/A  . Number of children: N/A  . Years of education: N/A   Social History Main Topics  . Smoking status: Never Smoker  . Smokeless tobacco: Never Used  . Alcohol use 0.0 oz/week     Comment: occ  . Drug use: No     Comment: quit using marijuana 3 years ago  . Sexual activity: Yes    Birth control/ protection: None   Other Topics Concern  . None   Social History Narrative  . None   Additional History:  She just bought a house in [place] with her husband and spending time with her kids. She has been working for 2 days only. She is on disability and stated that she wants to work in [place].   Assessment:   Musculoskeletal: Strength & Muscle Tone: within normal limits Gait & Station: normal Patient leans: N/A  Psychiatric Specialty Exam: Medication Refill  Review of Systems  Psychiatric/Behavioral: Positive for depression. The patient is nervous/anxious.   All other systems reviewed and are negative.   Blood pressure (!) 150/90, pulse 97, temperature 98 F (36.7 C), temperature source Oral, weight 196 lb 12.8 oz (89.3 kg).Body mass index is 36 kg/m.  General Appearance: Casual  Eye Contact:  Fair  Speech:  Clear and Coherent  Volume:  Normal  Mood:  Anxious  Affect:  Congruent  Thought Process:  Coherent  Orientation:  Full (Time, Place, and Person)  Thought Content:  WDL  Suicidal Thoughts:  No  Homicidal Thoughts:  No  Memory:  Immediate;   Fair  Judgement:  Fair  Insight:  Fair  Psychomotor Activity:  Normal  Concentration:  Fair  Recall:  [employer] of Knowledge: Fair  Language: Fair  Akathisia:  No  Handed:  Right  AIMS (if  indicated):  none  Assets:  Communication Skills Desire for Improvement Social Support  ADL's:  Intact  Cognition: WNL  Sleep:  7   Is the patient at risk to self?  No. Has the patient been a risk to self in the past 6 months?  No. Has the patient been a risk to self within the distant past?  No. Is the patient a risk to others?  No.  Has the patient been a risk to others in the past 6 months?  No. Has the patient been a risk to others within the distant past?  No.  Current Medications: Current Outpatient Prescriptions  Medication Sig Dispense Refill  . chlorthalidone (HYGROTON) 25 MG tablet Take 25 mg by mouth.  0  . cloNIDine (CATAPRES) 0.1 MG tablet take 1 tablet by mouth once daily for blood pressure with CHLORTH...  (REFER TO PRESCRIPTION NOTES).  0  . Magnesium 250 MG TABS Take by mouth.    Marland Kitchen POTASSIUM GLUCONATE ER PO Take 99 mg by mouth.    . Red Yeast Rice 600 MG CAPS Take by mouth.    . Vitamin D, Ergocalciferol, (DRISDOL) 50000 units CAPS capsule Take 50,000 Units by mouth every 7 (seven) days.    . ARIPiprazole (ABILIFY) 2 MG tablet Take 1 tablet (2 mg total) by mouth daily. 30 tablet 1   No current facility-administered medications for this visit.     Medical Decision Making:  Review of Psycho-Social Stressors (1) and Established Problem, Worsening (2)  Treatment Plan Summary:Medication management    I will start her on Abilify 2 mg daily. She will follow-up in one week.  She has a therapy appointment next week.   This note was generated in part or whole with voice recognition software. Voice regonition is usually quite accurate but there are transcription errors that can and very often do occur. I apologize for any typographical errors that were not detected and corrected.   Rainey Pines, MD  07/05/2016, 1:49 PM

## 2016-07-09 ENCOUNTER — Encounter: Payer: Self-pay | Admitting: Emergency Medicine

## 2016-07-09 ENCOUNTER — Emergency Department
Admission: EM | Admit: 2016-07-09 | Discharge: 2016-07-09 | Disposition: A | Discharge status: Home / Self Care | Payer: BLUE CROSS/BLUE SHIELD | Attending: Emergency Medicine | Admitting: Emergency Medicine

## 2016-07-09 ENCOUNTER — Emergency Department: Payer: BLUE CROSS/BLUE SHIELD

## 2016-07-09 DIAGNOSIS — I10 Essential (primary) hypertension: Secondary | ICD-10-CM | POA: Diagnosis not present

## 2016-07-09 DIAGNOSIS — R0602 Shortness of breath: Secondary | ICD-10-CM | POA: Diagnosis not present

## 2016-07-09 DIAGNOSIS — Z79899 Other long term (current) drug therapy: Secondary | ICD-10-CM | POA: Diagnosis not present

## 2016-07-09 DIAGNOSIS — E876 Hypokalemia: Secondary | ICD-10-CM | POA: Insufficient documentation

## 2016-07-09 DIAGNOSIS — Z87442 Personal history of urinary calculi: Secondary | ICD-10-CM | POA: Insufficient documentation

## 2016-07-09 DIAGNOSIS — E860 Dehydration: Secondary | ICD-10-CM

## 2016-07-09 DIAGNOSIS — F319 Bipolar disorder, unspecified: Secondary | ICD-10-CM | POA: Insufficient documentation

## 2016-07-09 DIAGNOSIS — R05 Cough: Secondary | ICD-10-CM | POA: Diagnosis not present

## 2016-07-09 LAB — URINALYSIS, COMPLETE (UACMP) WITH MICROSCOPIC
Bacteria, UA: NONE SEEN
Bilirubin Urine: NEGATIVE
Glucose, UA: NEGATIVE mg/dL
Ketones, ur: NEGATIVE mg/dL
Leukocytes, UA: NEGATIVE
Nitrite: NEGATIVE
Protein, ur: 30 mg/dL — AB
Specific Gravity, Urine: 1.017 (ref 1.005–1.030)
pH: 7 (ref 5.0–8.0)

## 2016-07-09 LAB — CBC
HCT: 40.1 % (ref 35.0–47.0)
Hemoglobin: 14.2 g/dL (ref 12.0–16.0)
MCH: 30.1 pg (ref 26.0–34.0)
MCHC: 35.4 g/dL (ref 32.0–36.0)
MCV: 85.1 fL (ref 80.0–100.0)
Platelets: 245 10*3/uL (ref 150–440)
RBC: 4.71 MIL/uL (ref 3.80–5.20)
RDW: 12.8 % (ref 11.5–14.5)
WBC: 7.5 10*3/uL (ref 3.6–11.0)

## 2016-07-09 LAB — MAGNESIUM: Magnesium: 2 mg/dL (ref 1.7–2.4)

## 2016-07-09 LAB — BASIC METABOLIC PANEL
Anion gap: 8 (ref 5–15)
BUN: 9 mg/dL (ref 6–20)
CO2: 27 mmol/L (ref 22–32)
Calcium: 9.3 mg/dL (ref 8.9–10.3)
Chloride: 101 mmol/L (ref 101–111)
Creatinine, Ser: 0.47 mg/dL (ref 0.44–1.00)
GFR calc Af Amer: >60 mL/min (ref >60)
GFR calc non Af Amer: >60 mL/min (ref >60)
Glucose, Bld: 115 mg/dL — ABNORMAL HIGH (ref 65–99)
Potassium: 2.8 mmol/L — ABNORMAL LOW (ref 3.5–5.1)
Sodium: 136 mmol/L (ref 135–145)

## 2016-07-09 LAB — PREGNANCY, URINE: Preg Test, Ur: NEGATIVE

## 2016-07-09 MED ORDER — POTASSIUM CHLORIDE 10 MEQ/100ML IV SOLN
10.0000 meq | Freq: Once | INTRAVENOUS | Status: AC
  Administered 2016-07-09: 10 meq via INTRAVENOUS
  Filled 2016-07-09: qty 100

## 2016-07-09 MED ORDER — SODIUM CHLORIDE 0.9 % IV BOLUS (SEPSIS)
1000.0000 mL | Freq: Once | INTRAVENOUS | Status: AC
  Administered 2016-07-09: 1000 mL via INTRAVENOUS

## 2016-07-09 MED ORDER — POTASSIUM CHLORIDE CRYS ER 20 MEQ PO TBCR
EXTENDED_RELEASE_TABLET | ORAL | Status: AC
  Filled 2016-07-09: qty 1

## 2016-07-09 MED ORDER — POTASSIUM CHLORIDE CRYS ER 20 MEQ PO TBCR
40.0000 meq | EXTENDED_RELEASE_TABLET | Freq: Once | ORAL | Status: AC
  Administered 2016-07-09: 40 meq via ORAL
  Filled 2016-07-09: qty 2

## 2016-07-09 MED ORDER — POTASSIUM CHLORIDE ER 20 MEQ PO TBCR: 10.0000 meq | EXTENDED_RELEASE_TABLET | Freq: Two times a day (BID) | ORAL | 0 refills | Status: DC

## 2016-07-09 NOTE — ED Provider Notes (Addendum)
[facility] Emergency Department Provider Note  ____________________________________________   I have reviewed the triage vital signs and the nursing notes.   HISTORY  Chief Complaint Headache and Dizziness    HPI Jasmine Barnes is a 31 y.o. female  who presents today complaining of feeling "not quite right. Patient does have a history of anxiety bipolar disorder depression and kidney stones, she is also been hypertensive her whole life. Her blood pressure is been well controlled. Recently they tried clonidine, but took her off it because she is not tolerating it 5 days ago. She was only on it for a week. She has also had some change in her psychiatric medications over the last couple months. She is under a great deal of stress. 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 She does not complain of any focal neurologic deficit and she actually denies headache to me she states she just feels dehydrated and "not quite right. No dysuria no urinary frequency no chest pain, she does have a slight cough. She denies any abdominal pain or vomiting or diarrhea. She states that she has been eating and drinking pretty well but given that he she worries she might be dehydrated. She denies pregnancy she did have her tubes tied in the past. Patient generally speaking denies any true syncopal events she states she feels somewhat lightheaded sometimes. All of these symptoms and been going on for several weeks. She is very tearful and upset. She states "I know part of this is my anxiety".    Past Medical History:  Diagnosis Date  . Anxiety   . Bipolar disorder ([place])   . Depression   . Kidney stone     Patient Active Problem List   Diagnosis Date Noted  . Cephalalgia 03/16/2014    Past Surgical History:  Procedure Laterality Date  . HERNIA REPAIR    . LITHOTRIPSY     x 2  . TUBAL LIGATION      Prior to Admission medications    Medication Sig Start Date End Date Taking? Authorizing Provider  ARIPiprazole (ABILIFY) 2 MG tablet Take 1 tablet (2 mg total) by mouth daily. 07/05/16   Rainey Pines, MD  chlorthalidone (HYGROTON) 25 MG tablet Take 25 mg by mouth. 05/13/15   [provider]  cloNIDine (CATAPRES) 0.1 MG tablet take 1 tablet by mouth once daily for blood pressure with CHLORTH...  (REFER TO PRESCRIPTION NOTES). 06/13/16   [provider]  Magnesium 250 MG TABS Take by mouth.    [provider]  POTASSIUM GLUCONATE ER PO Take 99 mg by mouth.    [provider]  Red Yeast Rice 600 MG CAPS Take by mouth.    [provider]  Vitamin D, Ergocalciferol, (DRISDOL) 50000 units CAPS capsule Take 50,000 Units by mouth every 7 (seven) days.    [provider]    Allergies Penicillin g  Family History  Problem Relation Age of Onset  . Alcohol abuse Mother   . Drug abuse Mother   . Bipolar disorder Mother   . Schizophrenia Father   . Alcohol abuse Father   . Drug abuse Father   . Kidney disease Neg Hx   . Bladder Cancer Neg Hx     Social History Social History  Substance Use Topics  . Smoking status: Never Smoker  . Smokeless tobacco: Never Used  . Alcohol use 0.0 oz/week  Comment: occ    Review of Systems Constitutional: No fever/chills Eyes: No visual changes. ENT: No sore throat. No stiff neck no neck pain Cardiovascular: Denies chest pain. Respiratory: Denies shortness of breath. Gastrointestinal:   no vomiting.  No diarrhea.  No constipation. Genitourinary: Negative for dysuria. Musculoskeletal: Negative lower extremity swelling Skin: Negative for rash. Neurological: Negative for severe headaches, focal weakness or numbness.   ____________________________________________   PHYSICAL EXAM:  VITAL SIGNS: ED Triage Vitals  Enc Vitals Group     BP 07/09/16 0807 (!) 131/111     Pulse Rate 07/09/16 0807 (!) 110     Resp 07/09/16 0807 20      Temp 07/09/16 0810 97.9 F (36.6 C)     Temp Source 07/09/16 0807 Oral     SpO2 07/09/16 0807 99 %     Weight 07/09/16 0807 196 lb (88.9 kg)     Height --      Head Circumference --      Peak Flow --      Pain Score --      Pain Loc --      Pain Edu? --      Excl. in [place]? --     Constitutional: Alert and oriented. Well appearing and in no acute distress. Eyes: Conjunctivae are normal Head: Atraumatic HEENT: No congestion/rhinnorhea. Mucous membranes are moist.  Oropharynx non-erythematous Neck:   Nontender with no meningismus, no masses, no stridor Cardiovascular: Normal rate, regular rhythm. Grossly normal heart sounds.  Good peripheral circulation. Respiratory: Normal respiratory effort.  No retractions. Lungs CTAB. Abdominal: Soft and nontender. No distention. No guarding no rebound Back:  There is no focal tenderness or step off.  there is no midline tenderness there are no lesions noted. there is no CVA tenderness Musculoskeletal: No lower extremity tenderness, no upper extremity tenderness. No joint effusions, no DVT signs strong distal pulses no edema Neurologic: Cranial nerves II through XII are grossly intact 5 out of 5 strength bilateral upper and lower extremity. Finger to nose within normal limits heel to shin within normal limits, speech is normal with no word finding difficulty or dysarthria, reflexes symmetric, pupils are equally round and reactive to light, there is no pronator drift, sensation is normal, vision is intact to confrontation, gait is deferred, there is no nystagmus, normal neurologic exam Skin:  Skin is warm, dry and intact. No rash noted. Psychiatric: Mood and affect are anxious and upset, tearful. Speech and behavior are normal.  ____________________________________________   LABS (all labs ordered are listed, but only abnormal results are displayed)  Labs Reviewed  BASIC METABOLIC PANEL - Abnormal; Notable for the following:       Result Value    Potassium 2.8 (*)    Glucose, Bld 115 (*)    All other components within normal limits  URINALYSIS, COMPLETE (UACMP) WITH MICROSCOPIC - Abnormal; Notable for the following:    Color, Urine YELLOW (*)    APPearance HAZY (*)    Hgb urine dipstick LARGE (*)    Protein, ur 30 (*)    Squamous Epithelial / LPF 0-5 (*)    All other components within normal limits  CBC  PREGNANCY, URINE  CBG MONITORING, ED   ____________________________________________  EKG  I personally interpreted any EKGs ordered by me or triage Sinus tach rate 102 normal axis, PVCs noted, no acute ischemic changes ____________________________________________  RADIOLOGY  I reviewed any imaging ordered by me or triage that were performed during my shift and,  if possible, patient and/or family made aware of any abnormal findings. ____________________________________________   PROCEDURES  Procedure(s) performed: None  Procedures  Critical Care performed: None  ____________________________________________   INITIAL IMPRESSION / ASSESSMENT AND PLAN / ED COURSE  Pertinent labs & imaging results that were available during my care of the patient were reviewed by me and considered in my medical decision making (see chart for details).  Patient here with a generalized feeling of feeling "groggy" Differential very broad and difficult to evaluate complaint. She has been feeling this way for several weeks. Worse over the last 4 days. Heat index is been very high. She does admit to not drink very many fluids. I did give her a liter of fluid and she states she feels 100% better. I note that her potassium is low, we are offering her repletion here orally and by IV. She has hematuria but she is on her menstrual period and would prefer not to have a urine eyes to catheterization. No evidence of infection. Chest x-ray shows no evidence of infection despite her slight cough. CT scan of the head I don't think is warranted she  has no headache or focal neurologic deficits and her NIH stroke scale is 0. She has had multiple recent changes in medication which can account for some of this. She does have a primary care doctor with him she calls very closely and she did see them a few days ago. I will continue to replete her potassium. I have discussed with her getting admitted the hospital for further potassium repletion ordering as an outpatient she very much would prefer to go home. I don't think this is unreasonable. I have stressed the need for outpatient follow-up. I suspect her HCTZ and located in her potassium. We will also check a magnesium.   ----------------------------------------- 1:18 PM on 07/09/2016 -----------------------------------------  Patient's laughing and joking feels 100% better requesting discharge. She does have potassium she was given to take at home several days ago but she has not started taking it and I have encouraged her to do so. Screening tests otherwise negative and patient in no acute distress we will discharge with extensive return precautions and follow-up   ____________________________________________   FINAL CLINICAL IMPRESSION(S) / ED DIAGNOSES  Final diagnoses:  None      This chart was dictated using voice recognition software.  Despite best efforts to proofread,  errors can occur which can change meaning.      Schuyler Amor, MD 07/09/16 1257    Schuyler Amor, MD 07/09/16 [phone]

## 2016-07-09 NOTE — ED Notes (Signed)
Patient denies pain and is resting comfortably.  

## 2016-07-09 NOTE — Discharge Instructions (Signed)
Youwould prefer not to stay in the hospital for potassium which is certainly not unreasonable but if you feel worse in any way please return to the emergency room. Please take potassium supplementation as directed at home and follow closely with your doctor tomorrow. If you feel worse in any way including chest pain shortness of breath nausea vomiting headache numbness weakness lightheadedness etc., please return to the emergency room

## 2016-07-09 NOTE — ED Triage Notes (Signed)
Pt with dizziness and headache since Wednesday.

## 2016-07-10 ENCOUNTER — Ambulatory Visit: Payer: Medicare Other | Admitting: Licensed Clinical Social Worker

## 2016-07-12 ENCOUNTER — Ambulatory Visit: Payer: Medicare Other | Admitting: Psychiatry

## 2016-07-13 ENCOUNTER — Ambulatory Visit (INDEPENDENT_AMBULATORY_CARE_PROVIDER_SITE_OTHER): Payer: BLUE CROSS/BLUE SHIELD | Admitting: Licensed Clinical Social Worker

## 2016-07-13 DIAGNOSIS — F4001 Agoraphobia with panic disorder: Secondary | ICD-10-CM | POA: Diagnosis not present

## 2016-07-13 DIAGNOSIS — F3160 Bipolar disorder, current episode mixed, unspecified: Secondary | ICD-10-CM | POA: Diagnosis not present
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
## 2016-07-17 ENCOUNTER — Other Ambulatory Visit: Payer: Self-pay | Admitting: Psychiatry

## 2016-07-25 ENCOUNTER — Telehealth: Payer: Self-pay

## 2016-07-25 NOTE — Telephone Encounter (Signed)
Reviewed the chart and saw that she was started on Abilify at 2 mg. Not sure if her symptoms are from the side effect of the medication. Please check that with her if her anxiety worsened since starting the Abilify

## 2016-07-25 NOTE — Telephone Encounter (Signed)
pt called states that she having a hard time with panic attacks.  she is having anviety bad. states panic attacks almost every day. can something be sent in until she can see dr. Gretel Acre next week on the 16th?

## 2016-07-25 NOTE — Telephone Encounter (Signed)
pt wanted to check status of getting something for panic attacks.  pt states that is why she see dr. [name] is for anxiety but dr. [name] keeps putting her on other medication that dont work.

## 2016-07-26 NOTE — Telephone Encounter (Signed)
I cannot describe anything for this patient based on her symptoms. She will need to follow up with Dr.Faheem or see a different doctor if she feels that her current doctor is not working for her.

## 2016-07-27 NOTE — Telephone Encounter (Signed)
Forward message to dr. Gretel Acre.

## 2016-07-31 ENCOUNTER — Ambulatory Visit (INDEPENDENT_AMBULATORY_CARE_PROVIDER_SITE_OTHER): Payer: Medicare Other | Admitting: Psychiatry

## 2016-07-31 DIAGNOSIS — F4001 Agoraphobia with panic disorder: Secondary | ICD-10-CM

## 2016-07-31 DIAGNOSIS — F3160 Bipolar disorder, current episode mixed, unspecified: Secondary | ICD-10-CM

## 2016-07-31 MED ORDER — ESCITALOPRAM OXALATE 5 MG PO TABS: 5.0000 mg | ORAL_TABLET | Freq: Every day | ORAL | 1 refills | Status: DC

## 2016-07-31 NOTE — Telephone Encounter (Signed)
Pt seen today

## 2016-07-31 NOTE — Progress Notes (Signed)
[facility] MD/PA/NP OP Progress Note  07/31/2016 9:39 AM Jasmine Barnes  MRN:  [id number]  Subjective:  Patient is a 31 year old female who presented for the follow-up appointment.  She reported that she continues to be anxious although her mood symptoms have started improving. She has called couple of times after she was started on Abilify and her medications were not changed. She reported that Abilify has helped her with her mood symptoms and she is feeling better. We discussed about drug use and patient stated that she has been using marijuana occasionally and was minimizing her use of cannabis. She reported that she has been going to school on a regular basis and is doing well. I discussed with her about  drug screen and she reported that she will have her drug screen done at her PCP office as she has an appointment this afternoon. She was receptive to the same.  She reported that she wanted to start taking Lexapro in combination with the Abilify at this time. She currently denied having any suicidal ideations or plans.   She reported that the Xanax does not help her and she does not have any history of ADD. Patient smells strongly of marijuana during the interview.   Chief Complaint:   Visit Diagnosis:     ICD-10-CM   1. 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 Anxiety   . Bipolar disorder ([place])   . Depression   . Kidney stone     Past Surgical History:  Procedure Laterality Date  . HERNIA REPAIR    . LITHOTRIPSY     x 2  . TUBAL LIGATION     Family History:  Family History  Problem Relation Age of Onset  . Alcohol abuse Mother   . Drug abuse Mother   . Bipolar disorder Mother   . Schizophrenia Father   . Alcohol abuse Father   . Drug abuse Father   . Kidney disease Neg Hx   . Bladder Cancer Neg Hx    Social History:  Social History    Social History  . Marital status: Single    Spouse name: N/A  . Number of children: N/A  . Years of education: N/A   Social History Main Topics  . Smoking status: Never Smoker  . Smokeless tobacco: Never Used  . Alcohol use 0.0 oz/week     Comment: occ  . Drug use: No     Comment: quit using marijuana 3 years ago  . Sexual activity: Yes    Birth control/ protection: None   Other Topics Concern  . Not on file   Social History Narrative  . No narrative on file   Additional History:  She just bought a house in [place] with her husband and spending time with her kids. She has been working for 2 days only. She is on disability and stated that she wants to work in [place].   Assessment:   Musculoskeletal: Strength & Muscle Tone: within normal limits Gait & Station: normal Patient leans: N/A  Psychiatric Specialty Exam: Medication Refill     Review of Systems  Psychiatric/Behavioral: Positive for depression. The patient is nervous/anxious.   All other systems reviewed and are negative.   Last menstrual period 07/09/2016.There is no height or weight on file to calculate BMI.  General Appearance: Casual  Eye  Contact:  Fair  Speech:  Clear and Coherent  Volume:  Normal  Mood:  Anxious  Affect:  Congruent  Thought Process:  Coherent  Orientation:  Full (Time, Place, and Person)  Thought Content:  WDL  Suicidal Thoughts:  No  Homicidal Thoughts:  No  Memory:  Immediate;   Fair  Judgement:  Fair  Insight:  Fair  Psychomotor Activity:  Normal  Concentration:  Fair  Recall:  [employer] of Knowledge: Fair  Language: Fair  Akathisia:  No  Handed:  Right  AIMS (if indicated):  none  Assets:  Communication Skills Desire for Improvement Social Support  ADL's:  Intact  Cognition: WNL  Sleep:  7   Is the patient at risk to self?  No. Has the patient been a risk to self in the past 6 months?  No. Has the patient been a risk to self within the distant past?  No. Is  the patient a risk to others?  No.  Has the patient been a risk to others in the past 6 months?  No. Has the patient been a risk to others within the distant past?  No.  Current Medications: Current Outpatient Prescriptions  Medication Sig Dispense Refill  . ARIPiprazole (ABILIFY) 2 MG tablet Take 1 tablet (2 mg total) by mouth daily. 30 tablet 1  . chlorthalidone (HYGROTON) 25 MG tablet Take 25 mg by mouth.  0  . cloNIDine (CATAPRES) 0.1 MG tablet take 1 tablet by mouth once daily for blood pressure with CHLORTH...  (REFER TO PRESCRIPTION NOTES).  0  . escitalopram (LEXAPRO) 5 MG tablet Take 1 tablet (5 mg total) by mouth daily. 30 tablet 1  . Magnesium 250 MG TABS Take by mouth.    . potassium chloride 20 MEQ TBCR Take 10 mEq by mouth 2 (two) times daily. 10 tablet 0  . POTASSIUM GLUCONATE ER PO Take 99 mg by mouth.    . Red Yeast Rice 600 MG CAPS Take by mouth.    . Vitamin D, Ergocalciferol, (DRISDOL) 50000 units CAPS capsule Take 50,000 Units by mouth every 7 (seven) days.     No current facility-administered medications for this visit.     Medical Decision Making:  Review of Psycho-Social Stressors (1) and Established Problem, Worsening (2)  Treatment Plan Summary:Medication management   Continue Abilify 2 mg daily.  I will add Lexapro 5 mg daily  She was given lab form for UDS stat at to be done today her PCP office  She will follow-up in one month  She has a therapy appointment next week.   This note was generated in part or whole with voice recognition software. Voice regonition is usually quite accurate but there are transcription errors that can and very often do occur. I apologize for any typographical errors that were not detected and corrected.   Rainey Pines, MD  07/31/2016, 9:39 AM

## 2016-08-01 ENCOUNTER — Other Ambulatory Visit: Payer: Self-pay | Admitting: Psychiatry

## 2016-08-09 LAB — PLEASE NOTE

## 2016-08-09 LAB — SPECIMEN STATUS REPORT

## 2016-08-13 ENCOUNTER — Other Ambulatory Visit: Payer: Self-pay | Admitting: Psychiatry

## 2016-08-28 ENCOUNTER — Ambulatory Visit (INDEPENDENT_AMBULATORY_CARE_PROVIDER_SITE_OTHER): Payer: BLUE CROSS/BLUE SHIELD | Admitting: Psychiatry

## 2016-08-28 ENCOUNTER — Encounter: Payer: Self-pay | Admitting: Psychiatry

## 2016-08-28 VITALS — BP 148/98 | HR 87 | Temp 99.1°F | Wt 206.2 lb

## 2016-08-28 DIAGNOSIS — F3160 Bipolar disorder, current episode mixed, unspecified: Secondary | ICD-10-CM | POA: Diagnosis not present

## 2016-08-28 DIAGNOSIS — F4001 Agoraphobia with panic disorder: Secondary | ICD-10-CM

## 2016-08-28 MED ORDER — ESCITALOPRAM OXALATE 5 MG PO TABS: 5.0000 mg | ORAL_TABLET | Freq: Every day | ORAL | 1 refills | Status: DC

## 2016-08-28 MED ORDER — ARIPIPRAZOLE 5 MG PO TABS: 5.0000 mg | ORAL_TABLET | Freq: Every day | ORAL | 1 refills | Status: DC

## 2016-08-28 NOTE — Progress Notes (Signed)
[facility] MD/PA/NP OP Progress Note  08/28/2016 11:53 AM Jasmine Barnes  MRN:  [id number]  Subjective:  Patient is a 31 year old female who presented for the follow-up appointment.  She reported that she has noticed significant improvement in her symptoms. She reported that she does not feel anxious and her mood is stable. She appeared calm during the interview. She reported that she has been taking Lexapro and Abilify as prescribed. She was showing pictures of her family. 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 She is ready to start school next week. She appeared calm and alert during the interview. She is sleeping almost 9 hours. She denied having any side effects of the medications. We discussed about increasing the dose of Abilify and she agreed with the plan. She is not seeing her PCP on a weekly basis at this time. She denied having any suicidal homicidal ideations or plans at this time.      Chief Complaint:  Chief Complaint    Follow-up; Medication Refill     Visit Diagnosis:     ICD-10-CM   1. 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 Anxiety   . Bipolar disorder ([place])   . Depression   . Kidney stone     Past Surgical History:  Procedure Laterality Date  . HERNIA REPAIR    . LITHOTRIPSY     x 2  . TUBAL LIGATION     Family History:  Family History  Problem Relation Age of Onset  . Alcohol abuse Mother   . Drug abuse Mother   . Bipolar disorder Mother   . Schizophrenia Father   . Alcohol abuse Father   . Drug abuse Father   . Kidney disease Neg Hx   . Bladder Cancer Neg Hx    Social History:  Social History   Social History  . Marital status: Single    Spouse name: N/A  . Number of children: N/A  . Years of education: N/A   Social History Main Topics  . Smoking status: Never Smoker  .  Smokeless tobacco: Never Used  . Alcohol use 0.0 oz/week     Comment: occ  . Drug use: No     Comment: quit using marijuana 3 years ago  . Sexual activity: Yes    Birth control/ protection: None   Other Topics Concern  . None   Social History Narrative  . None   Additional History:  She just bought a house in [place] with her husband and spending time with her kids. She has been working for 2 days only. She is on disability and stated that she wants to work in [place].   Assessment:   Musculoskeletal: Strength & Muscle Tone: within normal limits Gait & Station: normal Patient leans: N/A  Psychiatric Specialty Exam: Medication Refill     Review of Systems  Psychiatric/Behavioral: Positive for depression. The patient is nervous/anxious.   All other systems reviewed and are negative.   Blood pressure (!) 148/98, pulse 87, temperature 99.1 F (37.3 C), temperature source Oral, weight 206 lb 3.2 oz (93.5 kg), last menstrual period 07/28/2016.Body mass index is 37.71 kg/m.  General Appearance: Casual  Eye Contact:  Fair  Speech:  Clear and Coherent  Volume:  Normal  Mood:  Anxious  Affect:  Congruent  Thought  Process:  Coherent  Orientation:  Full (Time, Place, and Person)  Thought Content:  WDL  Suicidal Thoughts:  No  Homicidal Thoughts:  No  Memory:  Immediate;   Fair  Judgement:  Fair  Insight:  Fair  Psychomotor Activity:  Normal  Concentration:  Fair  Recall:  [employer] of Knowledge: Fair  Language: Fair  Akathisia:  No  Handed:  Right  AIMS (if indicated):  none  Assets:  Communication Skills Desire for Improvement Social Support  ADL's:  Intact  Cognition: WNL  Sleep:  7   Is the patient at risk to self?  No. Has the patient been a risk to self in the past 6 months?  No. Has the patient been a risk to self within the distant past?  No. Is the patient a risk to others?  No.  Has the patient been a risk to others in the past 6 months?  No. Has the  patient been a risk to others within the distant past?  No.  Current Medications: Current Outpatient Prescriptions  Medication Sig Dispense Refill  . ARIPiprazole (ABILIFY) 5 MG tablet Take 1 tablet (5 mg total) by mouth daily. 30 tablet 1  . chlorthalidone (HYGROTON) 25 MG tablet Take 25 mg by mouth.  0  . cloNIDine (CATAPRES) 0.1 MG tablet take 1 tablet by mouth once daily for blood pressure with CHLORTH...  (REFER TO PRESCRIPTION NOTES).  0  . escitalopram (LEXAPRO) 5 MG tablet Take 1 tablet (5 mg total) by mouth daily. 30 tablet 1  . Magnesium 250 MG TABS Take by mouth.    . potassium chloride 20 MEQ TBCR Take 10 mEq by mouth 2 (two) times daily. 10 tablet 0  . POTASSIUM GLUCONATE ER PO Take 99 mg by mouth.    . Red Yeast Rice 600 MG CAPS Take by mouth.    . Vitamin D, Ergocalciferol, (DRISDOL) 50000 units CAPS capsule Take 50,000 Units by mouth every 7 (seven) days.     No current facility-administered medications for this visit.     Medical Decision Making:  Review of Psycho-Social Stressors (1) and Established Problem, Worsening (2)  Treatment Plan Summary:Medication management   Continue Abilify 5 mg daily.   Lexapro 5 mg daily    She will follow-up in 2 month     This note was generated in part or whole with voice recognition software. Voice regonition is usually quite accurate but there are transcription errors that can and very often do occur. I apologize for any typographical errors that were not detected and corrected.   Rainey Pines, MD  08/28/2016, 11:53 AM

## 2016-09-12 ENCOUNTER — Other Ambulatory Visit: Payer: Self-pay | Admitting: Psychiatry

## 2016-10-11 ENCOUNTER — Encounter: Payer: Self-pay | Admitting: Emergency Medicine

## 2016-10-11 ENCOUNTER — Emergency Department: Payer: BLUE CROSS/BLUE SHIELD

## 2016-10-11 ENCOUNTER — Emergency Department
Admission: EM | Admit: 2016-10-11 | Discharge: 2016-10-11 | Disposition: A | Discharge status: Home / Self Care | Payer: BLUE CROSS/BLUE SHIELD | Attending: Emergency Medicine | Admitting: Emergency Medicine

## 2016-10-11 DIAGNOSIS — Z79899 Other long term (current) drug therapy: Secondary | ICD-10-CM | POA: Insufficient documentation

## 2016-10-11 DIAGNOSIS — R079 Chest pain, unspecified: Secondary | ICD-10-CM | POA: Insufficient documentation

## 2016-10-11 DIAGNOSIS — F418 Other specified anxiety disorders: Secondary | ICD-10-CM | POA: Diagnosis not present

## 2016-10-11 LAB — CBC
HCT: 37.5 % (ref 35.0–47.0)
Hemoglobin: 13.1 g/dL (ref 12.0–16.0)
MCH: 30.3 pg (ref 26.0–34.0)
MCHC: 35 g/dL (ref 32.0–36.0)
MCV: 86.5 fL (ref 80.0–100.0)
Platelets: 278 10*3/uL (ref 150–440)
RBC: 4.33 MIL/uL (ref 3.80–5.20)
RDW: 13.1 % (ref 11.5–14.5)
WBC: 8.4 10*3/uL (ref 3.6–11.0)

## 2016-10-11 LAB — BASIC METABOLIC PANEL
Anion gap: 10 (ref 5–15)
BUN: 10 mg/dL (ref 6–20)
CO2: 23 mmol/L (ref 22–32)
Calcium: 9.2 mg/dL (ref 8.9–10.3)
Chloride: 103 mmol/L (ref 101–111)
Creatinine, Ser: 0.64 mg/dL (ref 0.44–1.00)
GFR calc Af Amer: >60 mL/min (ref >60)
GFR calc non Af Amer: >60 mL/min (ref >60)
Glucose, Bld: 94 mg/dL (ref 65–99)
Potassium: 3.7 mmol/L (ref 3.5–5.1)
Sodium: 136 mmol/L (ref 135–145)

## 2016-10-11 LAB — TROPONIN I: Troponin I: <0.03 ng/mL (ref <0.03)

## 2016-10-11 MED ORDER — HYDROXYZINE HCL 25 MG PO TABS: 25.0000 mg | ORAL_TABLET | Freq: Three times a day (TID) | ORAL | 0 refills | Status: DC | PRN

## 2016-10-11 NOTE — ED Notes (Signed)
Pt reports she feels "fine" now but had chest pain, headache, blurred vision and SOB upon arrival. Pt reports having had increased stress this week and believes she has panic attacks but was unsure how to tell the difference between panic attacks and anxiety and heart attacks. Pt in NAD at this time. Denies pain at this time.  MD at bedside.

## 2016-10-11 NOTE — Discharge Instructions (Signed)
You have been seen in the emergency department today for chest pain. Your workup has shown normal results. As we discussed please follow-up with your primary care physician in the next 1-2 days for recheck. Return to the emergency department for any further chest pain, trouble breathing, or any other symptom personally concerning to yourself. °

## 2016-10-11 NOTE — ED Triage Notes (Signed)
Pt comes into the ED via POV c/o central chest pain that started yesterday.  Patient states it makes it hard for her to breath.  Patient states this feels different from her anxiety.  Described as tightness across the chest.  Patient denies dizziness, N/V, or radiating pain.

## 2016-10-11 NOTE — ED Provider Notes (Signed)
[facility] Emergency Department Provider Note  Time seen: 5:36 PM  I have reviewed the triage vital signs and the nursing notes.   HISTORY  Chief Complaint Chest Pain    HPI Jasmine Barnes is a 31 y.o. female With a past medical history of anxiety, depression, bipolar, presents the emergency department for chest pain. According to the patient for the past 2 days she has been experiencing chest discomfort across her chest which she describes as a tightness sensation occasionally with shortness of breath denies any nausea. Does state occasional diaphoresis. Denies any leg pain or swelling. Patient has a history of hypertension as well takes medications for this. Patient states a history of bad anxiety is currently on anxiety medications, states she will often get chest pain with her anxiety, but states this is been ongoing for 2 days and she is not sure if it is anxiety which worried her so she came to the emergency department for evaluation.  Past Medical History:  Diagnosis Date  . Anxiety   . Bipolar disorder ([place])   . Depression   . Kidney stone     Patient Active Problem List   Diagnosis Date Noted  . Cephalalgia 03/16/2014    Past Surgical History:  Procedure Laterality Date  . HERNIA REPAIR    . LITHOTRIPSY     x 2  . TUBAL LIGATION      Prior to Admission medications   Medication Sig Start Date End Date Taking? Authorizing Provider  ARIPiprazole (ABILIFY) 5 MG tablet Take 1 tablet (5 mg total) by mouth daily. 08/28/16   Rainey Pines, MD  chlorthalidone (HYGROTON) 25 MG tablet Take 25 mg by mouth. 05/13/15   [provider]  cloNIDine (CATAPRES) 0.1 MG tablet take 1 tablet by mouth once daily for blood pressure with CHLORTH...  (REFER TO PRESCRIPTION NOTES). 06/13/16   [provider]  escitalopram (LEXAPRO) 5 MG tablet Take 1 tablet (5 mg total) by mouth daily. 08/28/16   Rainey Pines, MD  Magnesium 250 MG TABS Take by  mouth.    [provider]  potassium chloride 20 MEQ TBCR Take 10 mEq by mouth 2 (two) times daily. 07/09/16   Schuyler Amor, MD  POTASSIUM GLUCONATE ER PO Take 99 mg by mouth.    [provider]  Red Yeast Rice 600 MG CAPS Take by mouth.    [provider]  Vitamin D, Ergocalciferol, (DRISDOL) 50000 units CAPS capsule Take 50,000 Units by mouth every 7 (seven) days.    [provider]    Allergies  Allergen Reactions  . Penicillin G Anaphylaxis    Family History  Problem Relation Age of Onset  . Alcohol abuse Mother   . Drug abuse Mother   . Bipolar disorder Mother   . Schizophrenia Father   . Alcohol abuse Father   . Drug abuse Father   . Kidney disease Neg Hx   . Bladder Cancer Neg Hx     Social History Social History  Substance Use Topics  . Smoking status: Never Smoker  . Smokeless tobacco: Never Used  . Alcohol use 0.0 oz/week     Comment: occ    Review of Systems Constitutional: Negative for fever Cardiovascular: intermittent chest pain/tightness Respiratory: intermittent shortness of breath Gastrointestinal: Negative for abdominal pain Musculoskeletal: Negative for leg pain or swelling All other ROS negative  ____________________________________________   PHYSICAL EXAM:  VITAL SIGNS: ED Triage Vitals  Enc Vitals Group  BP 10/11/16 1457 (!) 153/85     Pulse Rate 10/11/16 1457 80     Resp 10/11/16 1457 18     Temp 10/11/16 1457 98.6 F (37 C)     Temp Source 10/11/16 1457 Oral     SpO2 10/11/16 1457 99 %     Weight 10/11/16 1453 209 lb (94.8 kg)     Height 10/11/16 1453 5' 2.5" (1.588 m)     Head Circumference --      Peak Flow --      Pain Score 10/11/16 1453 9     Pain Loc --      Pain Edu? --      Excl. in [place]? --     Constitutional: Alert and oriented. Well appearing and in no distress. Eyes: Normal exam ENT   Head: Normocephalic and atraumatic.   Mouth/Throat: Mucous membranes are  moist. Cardiovascular: Normal rate, regular rhythm. No murmur Respiratory: Normal respiratory effort without tachypnea nor retractions. Breath sounds are clear  Gastrointestinal: Soft and nontender. No distention. Musculoskeletal: Nontender with normal range of motion in all extremities. No lower extremity tenderness or edema. Neurologic:  Normal speech and language. No gross focal neurologic deficits  Skin:  Skin is warm, dry and intact.  Psychiatric: Mood and affect are normal.   ____________________________________________    EKG  EKG reviewed and interpreted by myself shows normal sinus rhythm at 80 bpm, narrow QRS, normal axis, normal intervals, no concerning ST changes.  ____________________________________________    RADIOLOGY  chest x-ray normal  ____________________________________________   INITIAL IMPRESSION / ASSESSMENT AND PLAN / ED COURSE  Pertinent labs & imaging results that were available during my care of the patient were reviewed by me and considered in my medical decision making (see chart for details).  patient is a emergency department for intermittent chest discomfort and some shortness of breath of the past 2 days. Patient has significant anxiety no history of cardiovascular disease. Differential this time would include ACS, anxiety, less likely PE given normal vitals very low risk, or pneumonia with no infectious symptoms. Patient's chest x-ray is normal. Labs normal including negative troponin. Heart rate and pulse oximetry are normal. Exam is very reassuring. Highly suspect anxiety related chest pain. Discussed PCP follow-up with provided by normal chest pain return precautions.  ____________________________________________   FINAL CLINICAL IMPRESSION(S) / ED DIAGNOSES  chest pain    Harvest Dark, MD 10/11/16 1739

## 2016-10-12 ENCOUNTER — Other Ambulatory Visit: Payer: Self-pay | Admitting: Psychiatry

## 2016-11-11 ENCOUNTER — Other Ambulatory Visit: Payer: Self-pay | Admitting: Psychiatry

## 2016-11-27 ENCOUNTER — Ambulatory Visit: Payer: BLUE CROSS/BLUE SHIELD | Admitting: Psychiatry

## 2016-12-11 ENCOUNTER — Other Ambulatory Visit: Payer: Self-pay | Admitting: Psychiatry

## 2017-01-05 DIAGNOSIS — F410 Panic disorder [episodic paroxysmal anxiety] without agoraphobia: Secondary | ICD-10-CM | POA: Diagnosis not present

## 2017-01-05 DIAGNOSIS — E663 Overweight: Secondary | ICD-10-CM | POA: Diagnosis not present

## 2017-01-05 DIAGNOSIS — I10 Essential (primary) hypertension: Secondary | ICD-10-CM | POA: Diagnosis not present

## 2017-01-05 DIAGNOSIS — E785 Hyperlipidemia, unspecified: Secondary | ICD-10-CM | POA: Diagnosis not present

## 2017-01-05 IMAGING — CR DG ABDOMEN 1V
1 series · 2 of 2 positions shown · non-contrast
Comparison: [facility] January 23, 2014

CLINICAL DATA: Right-sided kidney stones, 2 weeks and hematuria,
currently without pain

EXAM:
ABDOMEN - 1 VIEW

[Series 1: dxr kidney ureter bladder · 0.14mm/px · 2 of 2 slices shown]
[im 1/2]
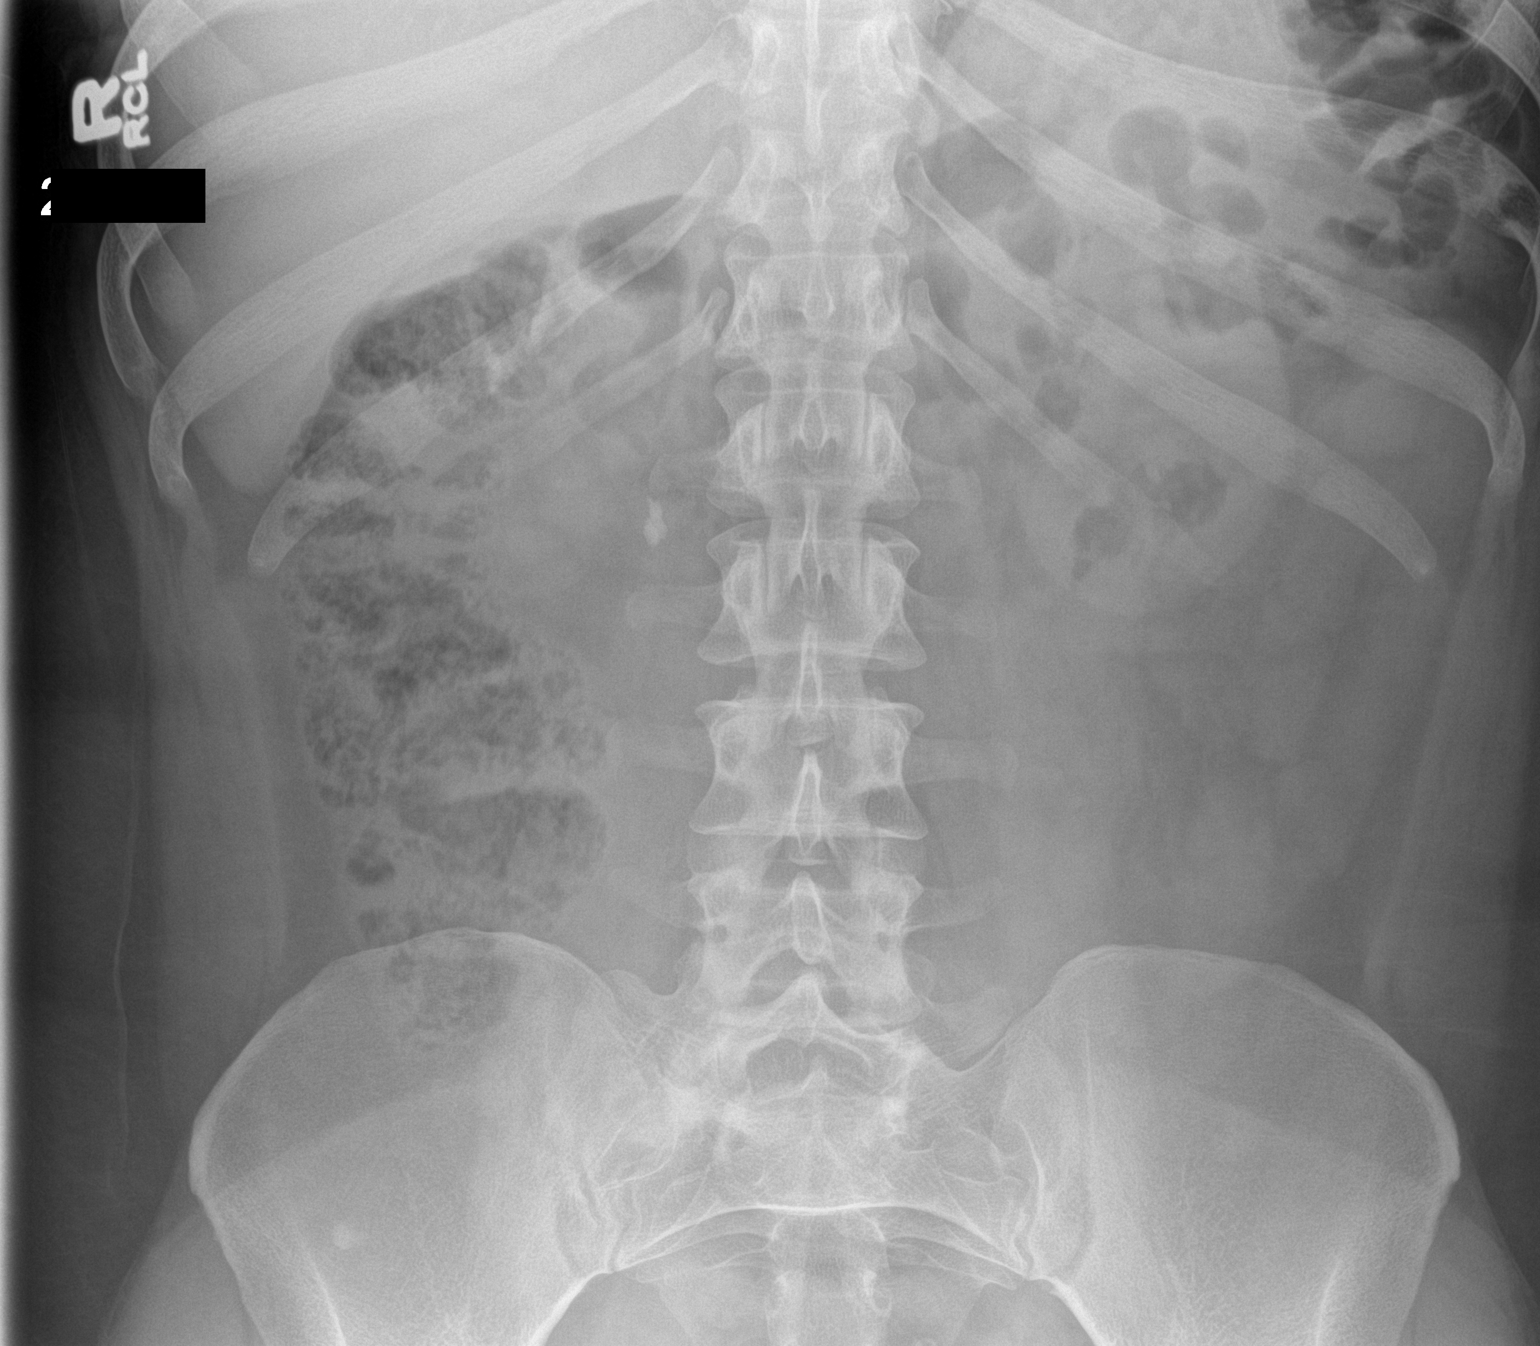
[im 2/2]
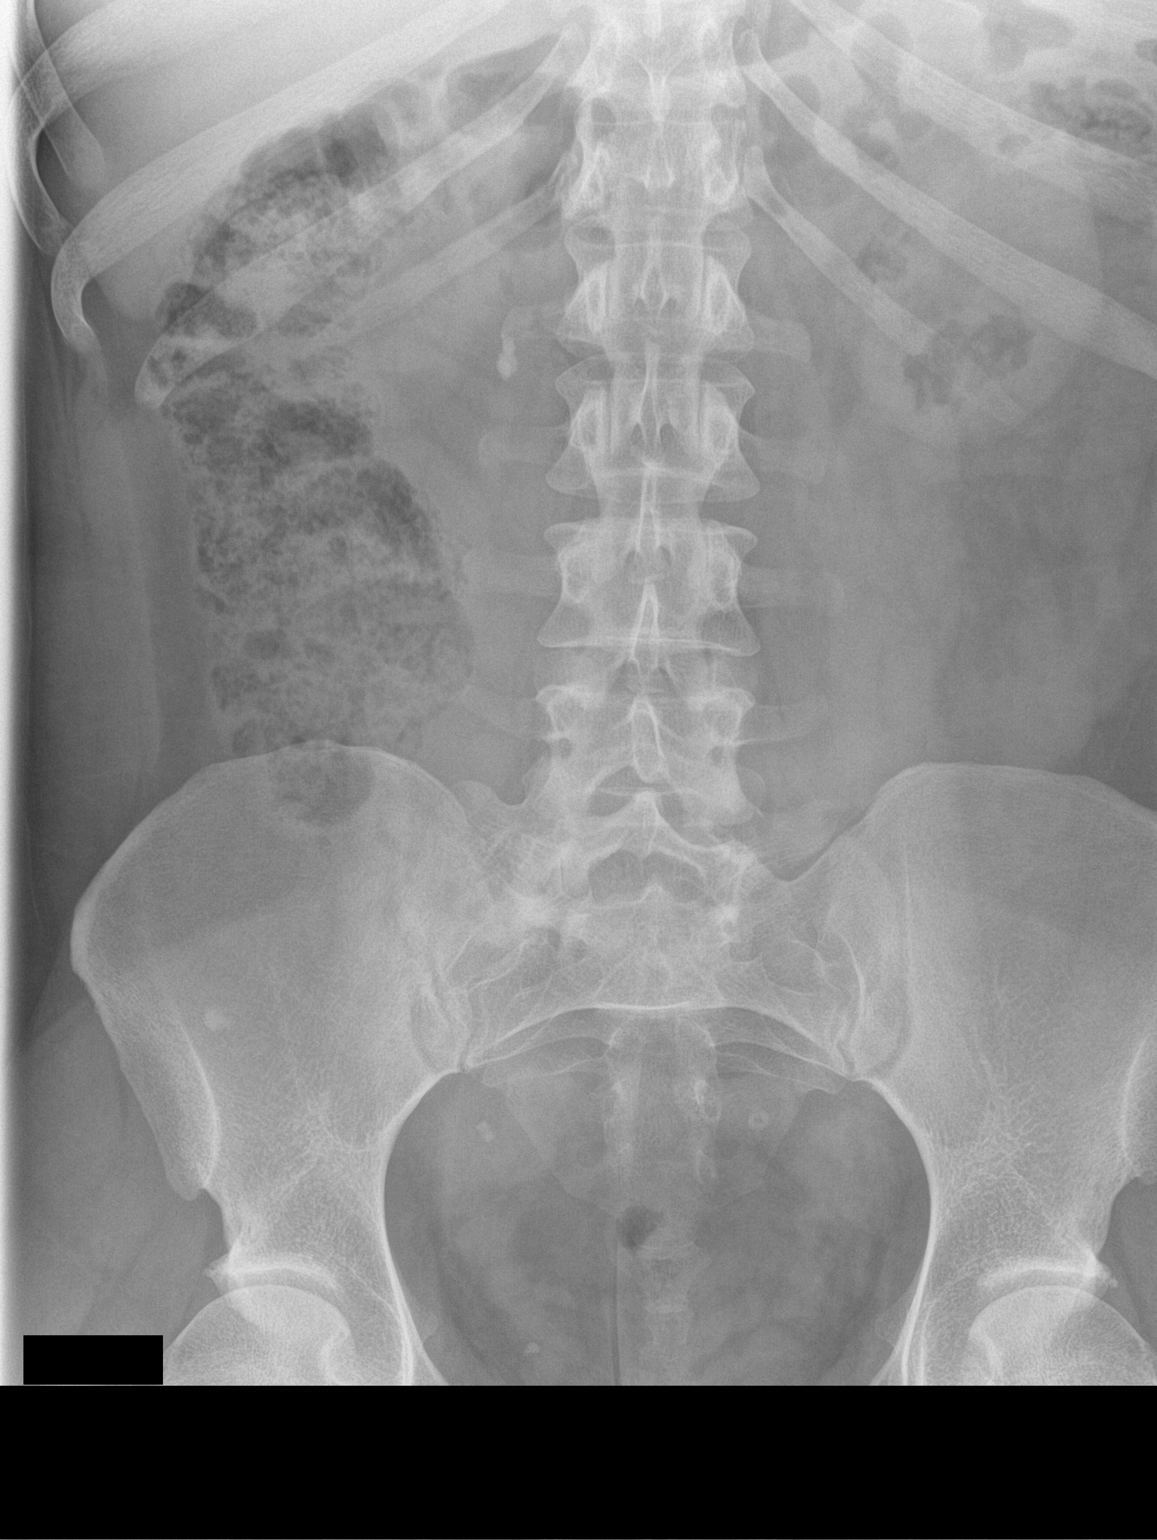

[2 of 2 positions shown; findings below may reference images not displayed]

FINDINGS: There are persistent dense calcifications just inferior to the tip
of the right L1 transverse process consistent with proximal ureteral
stones. No other stones on the right are demonstrated. No left-sided
stones are demonstrated. There is a rectangular radiodense in the
right aspect of the true bony pelvis which may reflect a ring like
structure on end that was demonstrated on the earlier study. The
bowel gas pattern is unremarkable. The bony structures exhibit no
acute abnormalities.
IMPRESSION: Persistent dense calcification projecting the region of the proximal
right ureter unchanged from the previous study.

## 2017-01-10 ENCOUNTER — Other Ambulatory Visit: Payer: Self-pay | Admitting: Psychiatry

## 2017-01-12 IMAGING — CR DG ABDOMEN 1V
1 series · 2 of 2 positions shown · non-contrast
Comparison: Prior [facility] radiographs 01/29/2014 and 01/23/2014

CLINICAL DATA: 28-year-old female with history of right-sided
nephrolithiasis status post lithotripsy 1 week ago.

EXAM:
ABDOMEN - 1 VIEW

[Series 1: kdxr kidney ureter bladder · 0.14mm/px · 2 of 2 slices shown]
[im 1/2]
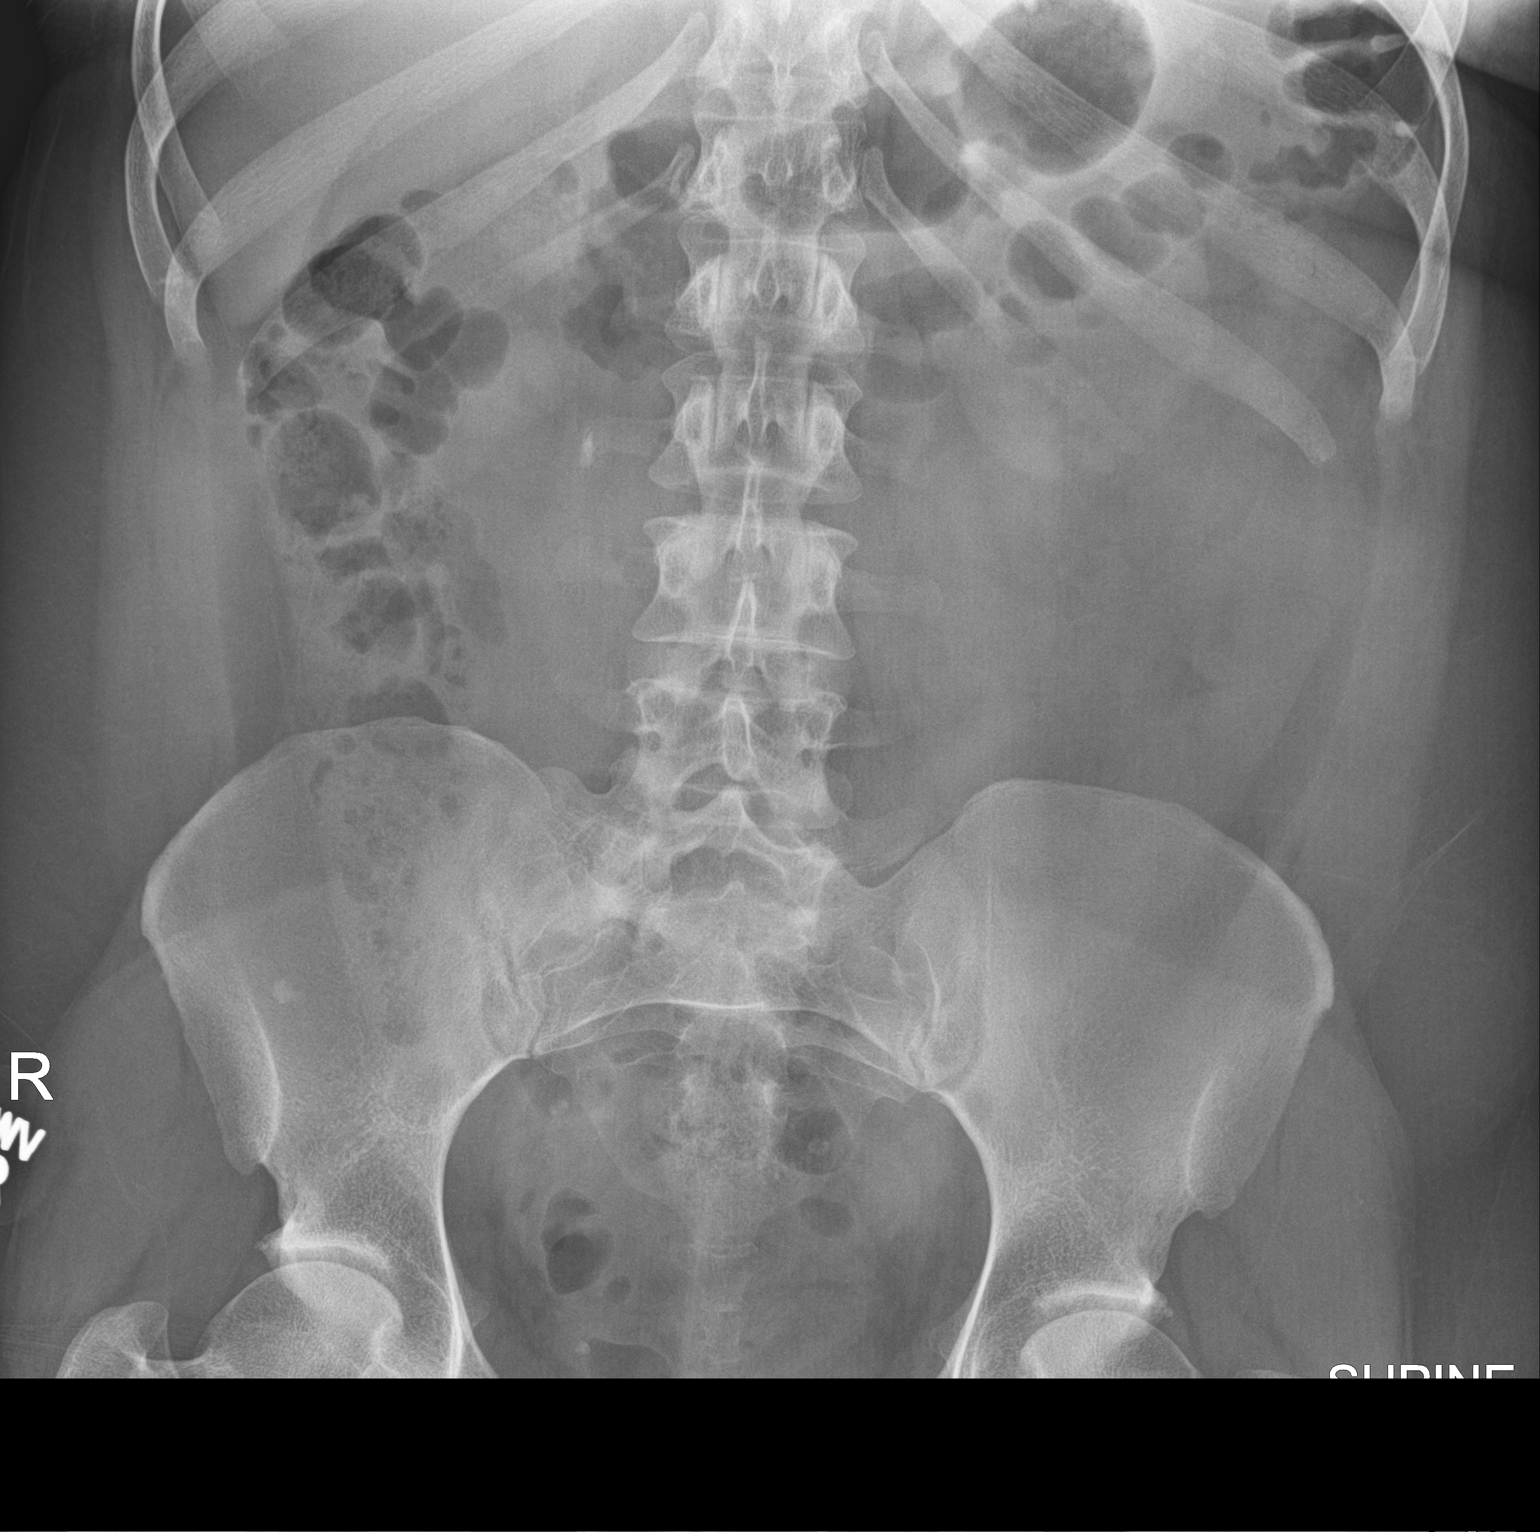
[im 2/2]
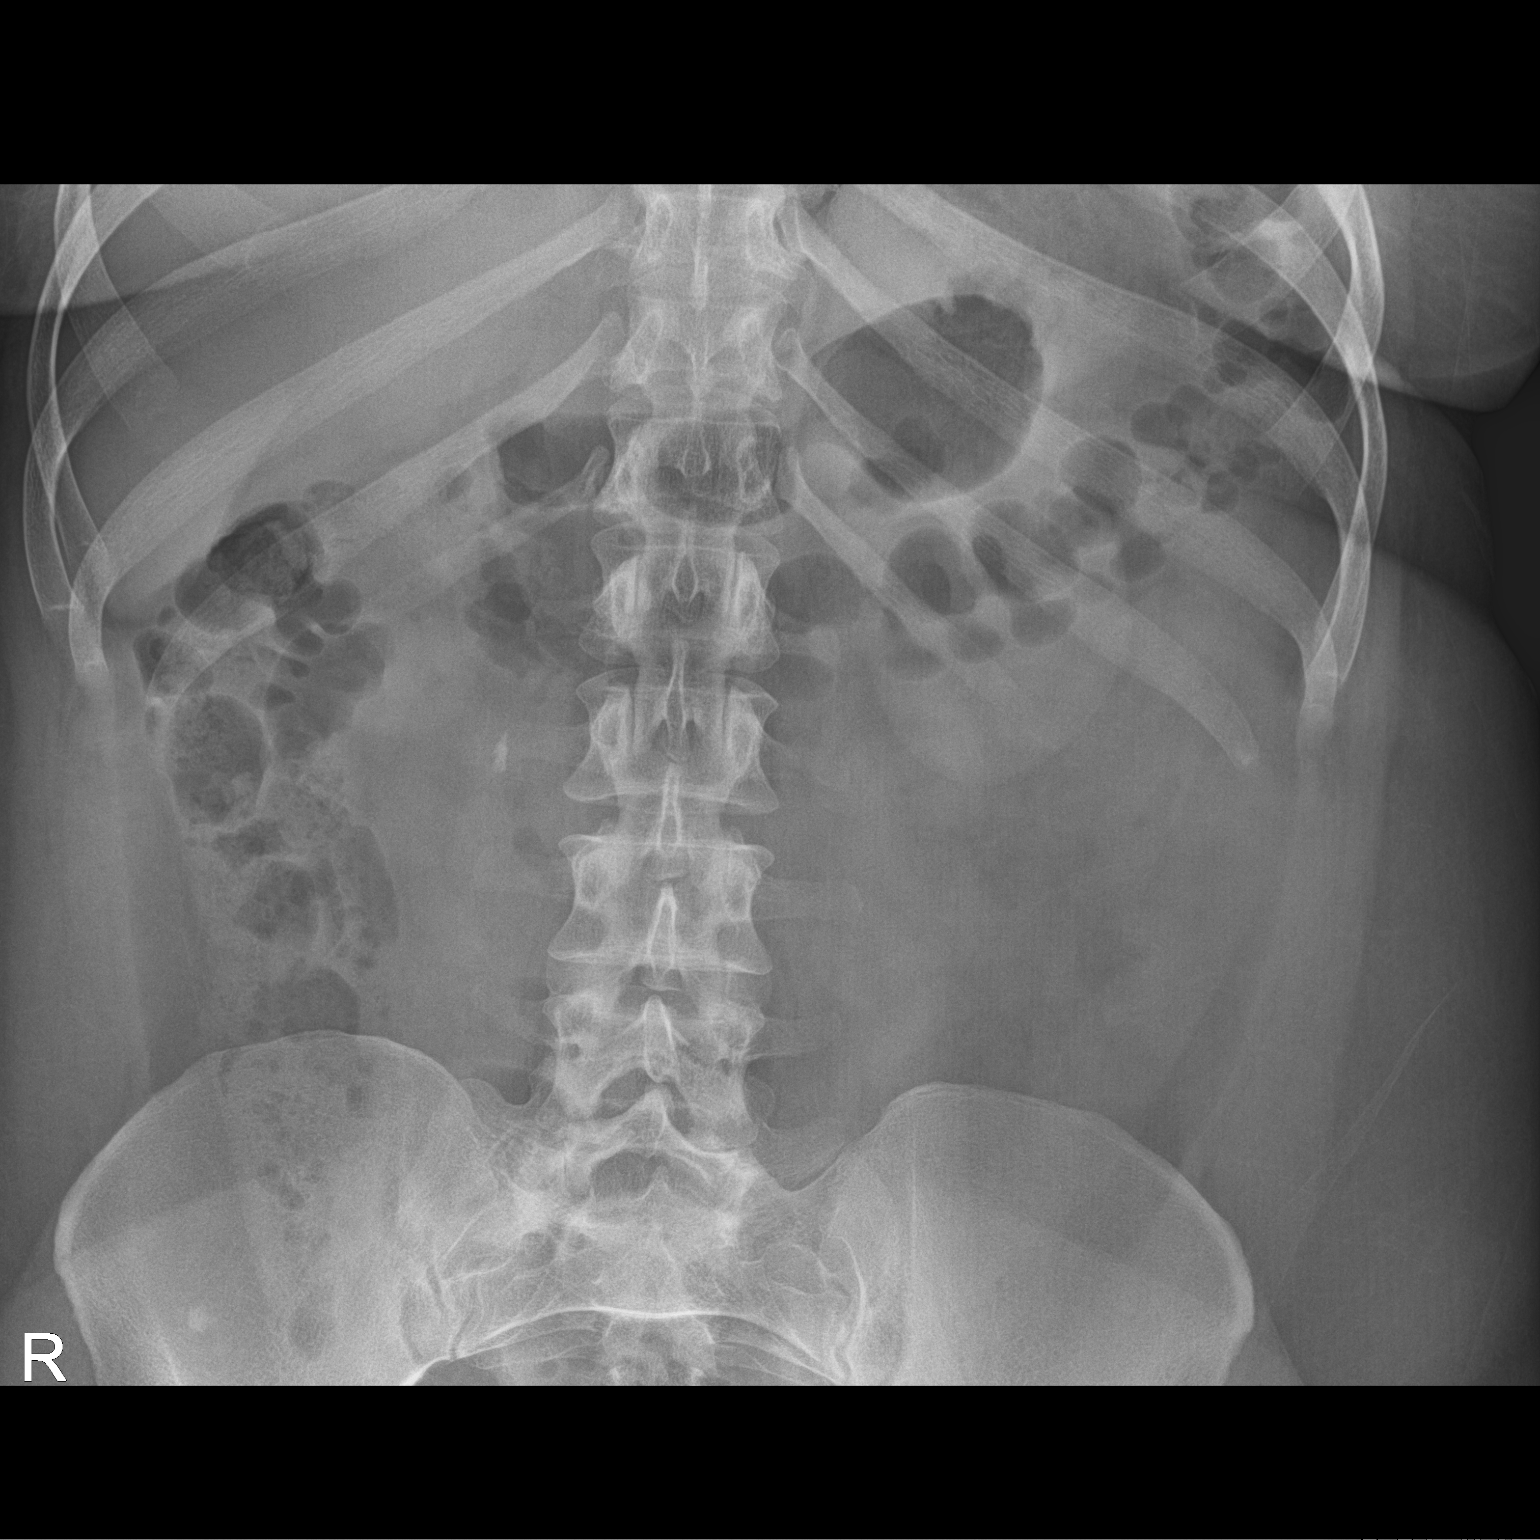

[2 of 2 positions shown; findings below may reference images not displayed]

FINDINGS: Decreasing bulk of the dense calcification projecting over the
expected location of the proximal right ureter. The stone measures
approximately 10 x 3 mm today compared to 12 x 5 mm on 01/23/2014.
No stone fragments are identified along the expected course of the
right ureter. Vascular phleboliths are again noted overlying the
anatomic pelvis.

Bowel gas pattern is unremarkable.  Normal osseous structures.
IMPRESSION: 1. Decreasing size of the right proximal ureteral stone which
presently measures 10 x 3 mm compared to 12 x 5 mm on 01/23/2014.
2. No stone fragments identified along the course of the right
ureter.

## 2017-01-17 DIAGNOSIS — E559 Vitamin D deficiency, unspecified: Secondary | ICD-10-CM | POA: Diagnosis not present

## 2017-01-17 DIAGNOSIS — R5383 Other fatigue: Secondary | ICD-10-CM | POA: Diagnosis not present

## 2017-01-17 DIAGNOSIS — F410 Panic disorder [episodic paroxysmal anxiety] without agoraphobia: Secondary | ICD-10-CM | POA: Diagnosis not present

## 2017-01-17 DIAGNOSIS — I10 Essential (primary) hypertension: Secondary | ICD-10-CM | POA: Diagnosis not present

## 2017-01-17 DIAGNOSIS — F329 Major depressive disorder, single episode, unspecified: Secondary | ICD-10-CM | POA: Diagnosis not present

## 2017-01-17 DIAGNOSIS — E785 Hyperlipidemia, unspecified: Secondary | ICD-10-CM | POA: Diagnosis not present

## 2018-02-08 ENCOUNTER — Ambulatory Visit: Payer: Self-pay | Admitting: Obstetrics and Gynecology

## 2018-02-11 ENCOUNTER — Ambulatory Visit: Payer: Self-pay | Admitting: Obstetrics and Gynecology

## 2018-02-25 ENCOUNTER — Ambulatory Visit: Payer: Self-pay | Admitting: Obstetrics and Gynecology

## 2018-05-09 ENCOUNTER — Other Ambulatory Visit: Payer: Self-pay

## 2018-05-09 ENCOUNTER — Encounter: Payer: BLUE CROSS/BLUE SHIELD | Attending: Nurse Practitioner | Admitting: Dietician

## 2018-05-09 ENCOUNTER — Encounter: Payer: Self-pay | Admitting: Dietician

## 2018-05-09 VITALS — Ht 61.0 in | Wt 235.7 lb

## 2018-05-09 DIAGNOSIS — I10 Essential (primary) hypertension: Secondary | ICD-10-CM

## 2018-05-09 DIAGNOSIS — Z6841 Body Mass Index (BMI) 40.0 and over, adult: Secondary | ICD-10-CM | POA: Diagnosis not present

## 2018-05-09 NOTE — Progress Notes (Signed)
Medical Nutrition Therapy: Visit start time: 4562  end time: 1650  Assessment:  Diagnosis: Hypertension, obesity Past medical history: hyperlipidemia Psychosocial issues/ stress concerns: anxiety, depression, high stress level (feels she does not deal well with stress)  Preferred learning method:  . Auditory . Visual . Hands-on   Current weight: 235.7lbs Height: 5'2" Medications, supplements: reconciled list in medical record.  Progress and evaluation:   Patient reports noticing weight gain about 6 years ago after having her youngest child. Has tried weight control meds in the past without success, has increased exercise without any weight loss.     Often unable to sleep at night, will get up and have snacks, especially sweets (cookies, brownies), then not hungry during the day. Struggles to decrease intake of sweets.     Lives with husband and 4 children. Most meals are prepared at home and include poultry and fish with a variety of vegetables.   Physical activity: 30 minutes 5x a week, light cardio via zoom  Dietary Intake:  Usual eating pattern includes 1-2 meals and multiple snacks per day. Dining out frequency: 0-1 meals per week.  Breakfast: none, not hungry Snack: none Lunch: today hamburger; potato salad, baked shrimp, salad; sometimes none, or eats snack foods Snack: nuts, chips Supper: often fish, chicken, occ lamb, rarely beef, no pork + veg incl salad Snack: cookies, multiple snacks from 11pm- 3am; tries to keep sweets out of the house, but other fam members bring them home.  Beverages: lemonade, water, cran or apple juice, occasionally decaf of 1/2-caff coffee; likes soy and almond milk, alcohol (2-3 drinks per week: wine, mixed drink, occasionally beer)  Nutrition Care Education: Topics covered: HTN, hyperlipidemia, weight control        Basic nutrition: basic food groups, appropriate nutrient balance, appropriate meal and snack schedule, general nutrition  guidelines    Weight control: benefits of weight control, identifying healthy weight, determining reasonable weight goal, basic needs for weight loss estimated at 1500kcal daily-- provided guidance for daily intake and appropriate food portions and balance; benefits of tracking food intake and options for doing so; role of stress and inadequate sleep in weight control.  Hypertension:  importance of controlling BP, identifying high sodium foods, identifying food sources of potassium, magnesium; discussed Mediterranean diet for heart health Hyperlipidemia:  healthy and unhealthy fats, role of fiber; role of weight loss and exercise   Nutritional Diagnosis:  [place]-2.2 Altered nutrition-related laboratory As related to hypertension.  As evidenced by patient with hypertension diagnosis since age 3. [place]-3.3 Overweight/obesity As related to excess calories, stress.  As evidenced by patient with current BMI of 44.54, and patient report of dietary intake.  Intervention:   Instruction as noted above.  Encouraged patient to work on stress management strategies and insomnia to help control food cravings.   Established nutrition goals with direction from patient.   Education Materials given:  . General diet guidelines for Heart healthy Eating . Mediterranean diet . Sample meal pattern/ menus . Goals/ instructions   Learner/ who was taught:  . Patient   Level of understanding: Marland Kitchen Verbalizes/ demonstrates competency   Demonstrated degree of understanding via:   Teach back Learning barriers: . None  Willingness to learn/ readiness for change: . Acceptance, ready for change   Monitoring and Evaluation:  Dietary intake, exercise, blood pressure and blood lipids, and body weight      follow up: 06/06/18

## 2018-05-09 NOTE — Patient Instructions (Signed)
   Plan for a balance of some protein, controlled portion of starch and/or fruit, and a generous portion of vegetables with meals.   Have a structure for meals and snacks during the day, eat something every 3-5 hours during the day.   Find ways to reduce portions of sweets at night, such as combining with a protein drink, or have "dressed up" fruit like a banana with a small amount of chocolate syrup.   Consider tracking food intake using [employer] or other app. Goal would be about 1500 calories daily.Marland Kitchen

## 2018-05-15 ENCOUNTER — Other Ambulatory Visit: Payer: Self-pay

## 2018-05-15 ENCOUNTER — Ambulatory Visit: Payer: BLUE CROSS/BLUE SHIELD | Admitting: Obstetrics and Gynecology

## 2018-06-07 ENCOUNTER — Ambulatory Visit: Payer: BLUE CROSS/BLUE SHIELD | Admitting: Dietician

## 2018-06-13 ENCOUNTER — Ambulatory Visit: Payer: BLUE CROSS/BLUE SHIELD | Admitting: Dietician

## 2018-06-25 ENCOUNTER — Encounter: Payer: Self-pay | Admitting: Dietician

## 2018-06-25 NOTE — Progress Notes (Signed)
Have not heard from patient to reschedule her second missed appointment. Sent letter to referring provider.

## 2018-06-26 ENCOUNTER — Ambulatory Visit: Payer: BLUE CROSS/BLUE SHIELD | Admitting: Obstetrics and Gynecology

## 2018-07-16 ENCOUNTER — Ambulatory Visit: Payer: BLUE CROSS/BLUE SHIELD | Admitting: Obstetrics and Gynecology

## 2018-12-20 ENCOUNTER — Other Ambulatory Visit (HOSPITAL_COMMUNITY)
Admission: RE | Admit: 2018-12-20 | Discharge: 2018-12-20 | Disposition: A | Discharge status: Home / Self Care | Payer: BC Managed Care – PPO | Source: Ambulatory Visit | Attending: Obstetrics and Gynecology | Admitting: Obstetrics and Gynecology

## 2018-12-20 ENCOUNTER — Ambulatory Visit (INDEPENDENT_AMBULATORY_CARE_PROVIDER_SITE_OTHER): Payer: BC Managed Care – PPO | Admitting: Obstetrics and Gynecology

## 2018-12-20 ENCOUNTER — Encounter: Payer: Self-pay | Admitting: Obstetrics and Gynecology

## 2018-12-20 ENCOUNTER — Other Ambulatory Visit: Payer: Self-pay

## 2018-12-20 VITALS — BP 148/108 | HR 79 | Ht 62.0 in | Wt 219.0 lb

## 2018-12-20 DIAGNOSIS — N939 Abnormal uterine and vaginal bleeding, unspecified: Secondary | ICD-10-CM

## 2018-12-20 DIAGNOSIS — Z124 Encounter for screening for malignant neoplasm of cervix: Secondary | ICD-10-CM | POA: Insufficient documentation

## 2018-12-20 NOTE — Progress Notes (Signed)
Gynecology Abnormal Uterine Bleeding Initial Evaluation   Chief Complaint:  Chief Complaint  Patient presents with  . Menometrorrhagia    History of Present Illness:    Paitient is a 33 y.o. No obstetric history on file. who LMP was Patient's last menstrual period was 11/25/2018., presents today for a problem visit.  She complains of menometrorrhagia that  began several months ago and its severity is described as severe.  The patient menstrual complaints are chronic present for the past 6 months.  She reports menstrual cycles approximately every 2 weeks and lasting up to 7 days with moderate cramping.  She has used the following for attempts at control: tampon and pad.  At its heaviest can go through 20 pads in a day.  The patient is sexually active. She is not currently on hormonal contraception.    Previous evaluation: none Previous Treatment: none  Denies changes in weight, acne, hirsutism, temperature intolerance, constipation, diarrhea, headaches, vision changes, or nipple discharge  LMP: Patient's last menstrual period was 11/25/2018.  Review of Systems: Review of Systems  Constitutional: Negative.   Gastrointestinal: Negative.   Neurological: Negative for headaches.    Past Medical History:  Past Medical History:  Diagnosis Date  . Anxiety   . Bipolar disorder ([place])   . Depression   . Kidney stone     Past Surgical History:  Past Surgical History:  Procedure Laterality Date  . HERNIA REPAIR    . LITHOTRIPSY     x 2  . TUBAL LIGATION      Obstetric History: No obstetric history on file.  Family History:  Family History  Problem Relation Age of Onset  . Alcohol abuse Mother   . Drug abuse Mother   . Bipolar disorder Mother   . Schizophrenia Father   . Alcohol abuse Father   . Drug abuse Father   . Kidney disease Neg Hx   . Bladder Cancer Neg Hx     Social History:  Social History   Socioeconomic History  . Marital status: Single    Spouse  name: Not on file  . Number of children: Not on file  . Years of education: Not on file  . Highest education level: Not on file  Occupational History  . Not on file  Social Needs  . Financial resource strain: Not on file  . Food insecurity    Worry: Not on file    Inability: Not on file  . Transportation needs    Medical: Not on file    Non-medical: Not on file  Tobacco Use  . Smoking status: Never Smoker  . Smokeless tobacco: Never Used  Substance and Sexual Activity  . Alcohol use: Yes    Alcohol/week: 2.0 - 3.0 standard drinks    Types: 2 - 3 Standard drinks or equivalent per week    Comment: wine, mixed drinks, occasional beer  . Drug use: No    Comment: quit using marijuana 3 years ago  . Sexual activity: Yes    Birth control/protection: None, Surgical    Comment: Tubal Ligation  Lifestyle  . Physical activity    Days per week: Not on file    Minutes per session: Not on file  . Stress: Not on file  Relationships  . Social Herbalist on phone: Not on file    Gets together: Not on file    Attends religious service: Not on file    Active member of club  or organization: Not on file    Attends meetings of clubs or organizations: Not on file    Relationship status: Not on file  . Intimate partner violence    Fear of current or ex partner: Not on file    Emotionally abused: Not on file    Physically abused: Not on file    Forced sexual activity: Not on file  Other Topics Concern  . Not on file  Social History Narrative  . Not on file    Allergies:  Allergies  Allergen Reactions  . Penicillin G Anaphylaxis    Medications: Prior to Admission medications   Medication Sig Start Date End Date Taking? Authorizing Provider  ALPRAZolam (XANAX) 0.5 MG tablet TAKE 1/2 TABLET BY MOUTH EVERY 8 HOURS AS NEEDED FOR ELEVATED ANXIETY 03/07/18  Yes [provider]  amLODipine (NORVASC) 2.5 MG tablet TAKE 1 TABLET BY MOUTH DAILY IN TH MORING FOR BP 04/30/18   Yes [provider]  Ascorbic Acid (VITAMIN C) 100 MG tablet Take 100 mg by mouth daily.   Yes [provider]  fluticasone (FLONASE) 50 MCG/ACT nasal spray Place 1 spray into both nostrils 2 (two) times daily. 04/19/18  Yes [provider]  lisdexamfetamine (VYVANSE) 30 MG capsule Take 30 mg by mouth daily.   Yes [provider]  Magnesium 250 MG TABS Take by mouth.   Yes [provider]  metoprolol succinate (TOPROL-XL) 50 MG 24 hr tablet  04/19/18  Yes [provider]  Vitamin D, Ergocalciferol, (DRISDOL) 50000 units CAPS capsule Take 50,000 Units by mouth every 7 (seven) days.   Yes [provider]    Physical Exam Blood pressure (!) 148/108, pulse 79, height 5\' 2"  (1.575 m), weight 219 lb (99.3 kg), last menstrual period 11/25/2018.  Patient's last menstrual period was 11/25/2018.  General: NAD HEENT: normocephalic, anicteric Thyroid: no enlargement, no palpable nodules Pulmonary: No increased work of breathing Cardiovascular: RRR, distal pulses 2+ Abdomen: NABS, soft, non-tender, non-distended.  Umbilicus without lesions.  No hepatomegaly, splenomegaly or masses palpable. No evidence of hernia  Genitourinary:  External: Normal external female genitalia.  Normal urethral meatus, normal Bartholin's and Skene's glands.    Vagina: Normal vaginal mucosa, no evidence of prolapse.    Cervix: Grossly normal in appearance, no bleeding  Uterus: Non-enlarged, mobile, normal contour.  No CMT  Adnexa: ovaries non-enlarged, no adnexal masses  Rectal: deferred  Lymphatic: no evidence of inguinal lymphadenopathy Extremities: no edema, erythema, or tenderness Neurologic: Grossly intact Psychiatric: mood appropriate, affect full  Female chaperone present for pelvic portions of the physical exam  Assessment: 33 y.o. No obstetric history on file. with abnormal uterine bleeding  Plan: Problem List Items Addressed This Visit    None     Visit Diagnoses    Abnormal uterine bleeding    -  Primary   Relevant Orders   US Transvaginal Non-OB   PCOS panel (TSH+PRL+FSH+TESTT+LH+DHEA S...)   Screening for malignant neoplasm of cervix       Relevant Orders   Cytology - PAP (Completed)      1) Discussed management options for abnormal uterine bleeding including expectant, NSAIDs, tranexamic acid (Lysteda), oral progesterone (Provera, norethindrone, megace), Depo Provera, Levonorgestrel containing IUD, endometrial ablation (Novasure) or hysterectomy as definitive surgical management.  Discussed risks and benefits of each method.   Final management decision will hinge on results of patient's work up and whether an underlying etiology for the patients bleeding symptoms can be discerned.  We  will conduct a basic work up examining using the PALM-COIEN classification system. The role of unopposed estrogen in the development of endometrial hyperplasia or carcinoma is discussed.  The risk of endometrial hyperplasia is linearly correlated with increasing BMI given the production of estrone by adipose tissue. Printed patient education handouts were given to the patient to review at home.  Bleeding precautions reviewed.   2) Return in about 1 week (around 12/27/2018) for 1-2 weeks TVUS and follow up ([place] can be internal or [place]).   Malachy Mood, MD, [facility], [place]

## 2018-12-24 LAB — CYTOLOGY - PAP
Comment: NEGATIVE
Comment: NEGATIVE
Comment: NEGATIVE
Diagnosis: NEGATIVE
HPV 16: NEGATIVE
HPV 18 / 45: NEGATIVE
High risk HPV: POSITIVE — AB

## 2018-12-26 ENCOUNTER — Telehealth: Payer: Self-pay

## 2018-12-26 LAB — TSH+PRL+FSH+TESTT+LH+DHEA S...
17-Hydroxyprogesterone: 214 ng/dL
Androstenedione: 185 ng/dL (ref 41–262)
DHEA-SO4: 357 ug/dL (ref 84.8–378.0)
FSH: 1.7 m[IU]/mL
LH: 3.9 m[IU]/mL
Prolactin: 13 ng/mL (ref 4.8–23.3)
TSH: 0.903 u[IU]/mL (ref 0.450–4.500)
Testosterone, Free: 4.9 pg/mL — ABNORMAL HIGH (ref 0.0–4.2)
Testosterone: 40 ng/dL (ref 8–48)

## 2018-12-26 NOTE — Telephone Encounter (Signed)
Pt calling; wants a call back.  [phone]  Pt states she was calling for results from pap and blood work.  Adv they were back; not sure AMS has seen them and we are not allowed to give results unless provider has seen them.  Pt aware AMS is on call today.

## 2019-01-01 ENCOUNTER — Ambulatory Visit: Payer: BC Managed Care – PPO | Attending: Internal Medicine

## 2019-01-01 ENCOUNTER — Other Ambulatory Visit: Payer: Self-pay

## 2019-01-01 DIAGNOSIS — Z20822 Contact with and (suspected) exposure to covid-19: Secondary | ICD-10-CM

## 2019-01-02 ENCOUNTER — Other Ambulatory Visit: Payer: BC Managed Care – PPO

## 2019-01-03 LAB — NOVEL CORONAVIRUS, NAA: SARS-CoV-2, NAA: NOT DETECTED

## 2019-01-17 DIAGNOSIS — E282 Polycystic ovarian syndrome: Secondary | ICD-10-CM

## 2019-01-17 DIAGNOSIS — D219 Benign neoplasm of connective and other soft tissue, unspecified: Secondary | ICD-10-CM

## 2019-01-17 HISTORY — DX: Benign neoplasm of connective and other soft tissue, unspecified: D21.9

## 2019-01-17 HISTORY — DX: Polycystic ovarian syndrome: E28.2

## 2019-01-20 ENCOUNTER — Telehealth: Payer: Self-pay

## 2019-01-20 ENCOUNTER — Ambulatory Visit (INDEPENDENT_AMBULATORY_CARE_PROVIDER_SITE_OTHER): Payer: BC Managed Care – PPO | Admitting: Obstetrics and Gynecology

## 2019-01-20 ENCOUNTER — Encounter: Payer: Self-pay | Admitting: Obstetrics and Gynecology

## 2019-01-20 ENCOUNTER — Other Ambulatory Visit: Payer: Self-pay

## 2019-01-20 ENCOUNTER — Ambulatory Visit (INDEPENDENT_AMBULATORY_CARE_PROVIDER_SITE_OTHER): Payer: BC Managed Care – PPO

## 2019-01-20 VITALS — BP 150/98 | Ht 62.5 in | Wt 219.0 lb

## 2019-01-20 DIAGNOSIS — N939 Abnormal uterine and vaginal bleeding, unspecified: Secondary | ICD-10-CM

## 2019-01-20 DIAGNOSIS — E282 Polycystic ovarian syndrome: Secondary | ICD-10-CM | POA: Diagnosis not present

## 2019-01-20 DIAGNOSIS — D252 Subserosal leiomyoma of uterus: Secondary | ICD-10-CM | POA: Diagnosis not present

## 2019-01-20 DIAGNOSIS — L700 Acne vulgaris: Secondary | ICD-10-CM | POA: Diagnosis not present

## 2019-01-20 MED ORDER — TRETINOIN 0.025 % EX GEL: Freq: Every day | CUTANEOUS | 0 refills | Status: DC

## 2019-01-20 NOTE — Telephone Encounter (Signed)
Patient saw AMS this morning. She is verifying that he is going to rx her the cream for her face. States they got sidetracked with other things. [id number]

## 2019-01-20 NOTE — Telephone Encounter (Signed)
Yes had been sent

## 2019-01-20 NOTE — Telephone Encounter (Signed)
Patient aware.

## 2019-01-20 NOTE — Progress Notes (Signed)
Gynecology Ultrasound Follow Up  Chief Complaint:  Chief Complaint  Patient presents with  . Follow-up     History of Present Illness: Patient is a 34 y.o. female who presents today for ultrasound evaluation of AUB.  Ultrasound demonstrates the following findgins Adnexa: no masses seen Uterus: Non-enlarged, one small subserosal fibroid with endometrial stripe  Without focal abnormalities Additional: no free fluid  Review of Systems: Review of Systems  Constitutional: Negative.   Gastrointestinal: Negative.   Genitourinary: Negative.   Skin: Positive for rash.    Past Medical History:  Past Medical History:  Diagnosis Date  . Anxiety   . Bipolar disorder ([place])   . Depression   . Kidney stone     Past Surgical History:  Past Surgical History:  Procedure Laterality Date  . HERNIA REPAIR    . LITHOTRIPSY     x 2  . TUBAL LIGATION      Gynecologic History:  Patient's last menstrual period was 12/25/2018.   Family History:  Family History  Problem Relation Age of Onset  . Alcohol abuse Mother   . Drug abuse Mother   . Bipolar disorder Mother   . Schizophrenia Father   . Alcohol abuse Father   . Drug abuse Father   . Kidney disease Neg Hx   . Bladder Cancer Neg Hx     Social History:  Social History   Socioeconomic History  . Marital status: Single    Spouse name: Not on file  . Number of children: Not on file  . Years of education: Not on file  . Highest education level: Not on file  Occupational History  . Not on file  Tobacco Use  . Smoking status: Never Smoker  . Smokeless tobacco: Never Used  Substance and Sexual Activity  . Alcohol use: Yes    Alcohol/week: 2.0 - 3.0 standard drinks    Types: 2 - 3 Standard drinks or equivalent per week    Comment: wine, mixed drinks, occasional beer  . Drug use: No    Comment: quit using marijuana 3 years ago  . Sexual activity: Yes    Birth control/protection: None, Surgical    Comment: Tubal  Ligation  Other Topics Concern  . Not on file  Social History Narrative  . Not on file   Social Determinants of Health   Financial Resource Strain:   . Difficulty of Paying Living Expenses: Not on file  Food Insecurity:   . Worried About Charity fundraiser in the Last Year: Not on file  . Ran Out of Food in the Last Year: Not on file  Transportation Needs:   . Lack of Transportation (Medical): Not on file  . Lack of Transportation (Non-Medical): Not on file  Physical Activity:   . Days of Exercise per Week: Not on file  . Minutes of Exercise per Session: Not on file  Stress:   . Feeling of Stress : Not on file  Social Connections:   . Frequency of Communication with Friends and Family: Not on file  . Frequency of Social Gatherings with Friends and Family: Not on file  . Attends Religious Services: Not on file  . Active Member of Clubs or Organizations: Not on file  . Attends Archivist Meetings: Not on file  . Marital Status: Not on file  Intimate Partner Violence:   . Fear of Current or Ex-Partner: Not on file  . Emotionally Abused: Not on file  . Physically  Abused: Not on file  . Sexually Abused: Not on file    Allergies:  Allergies  Allergen Reactions  . Penicillin G Anaphylaxis    Medications: Prior to Admission medications   Medication Sig Start Date End Date Taking? Authorizing Provider  ALPRAZolam (XANAX) 0.5 MG tablet TAKE 1/2 TABLET BY MOUTH EVERY 8 HOURS AS NEEDED FOR ELEVATED ANXIETY 03/07/18  Yes [provider]  amLODipine (NORVASC) 2.5 MG tablet TAKE 1 TABLET BY MOUTH DAILY IN TH MORING FOR BP 04/30/18  Yes [provider]  Ascorbic Acid (VITAMIN C) 100 MG tablet Take 100 mg by mouth daily.   Yes [provider]  fluticasone (FLONASE) 50 MCG/ACT nasal spray Place 1 spray into both nostrils 2 (two) times daily. 04/19/18  Yes [provider]  lisdexamfetamine (VYVANSE) 30 MG capsule Take 30 mg by mouth daily.   Yes  [provider]  Magnesium 250 MG TABS Take by mouth.   Yes [provider]  metoprolol succinate (TOPROL-XL) 50 MG 24 hr tablet  04/19/18  Yes [provider]  Vitamin D, Ergocalciferol, (DRISDOL) 50000 units CAPS capsule Take 50,000 Units by mouth every 7 (seven) days.   Yes [provider]    Physical Exam Vitals: Blood pressure (!) 150/98, height 5' 2.5" (1.588 m), weight 219 lb (99.3 kg), last menstrual period 12/25/2018. Body mass index is 39.42 kg/m.  General: NAD HEENT: normocephalic, anicteric Pulmonary: No increased work of breathing Extremities: no edema, erythema, or tenderness Neurologic: Grossly intact, normal gait Psychiatric: mood appropriate, affect full  Recent Results (from the past 2160 hour(s))  PCOS panel (TSH+PRL+FSH+TESTT+LH+DHEA S...)     Status: Abnormal   Collection Time: 12/20/18  2:36 PM  Result Value Ref Range   TSH 0.903 0.450 - 4.500 uIU/mL   Testosterone 40 8 - 48 ng/dL   Testosterone, Free 4.9 (H) 0.0 - 4.2 pg/mL   LH 3.9 mIU/mL    Comment:                     Adult Female:                       Follicular phase      2.4 -  12.6                       Ovulation phase      14.0 -  95.6                       Luteal phase          1.0 -  11.4                       Postmenopausal        7.7 -  58.5    FSH 1.7 mIU/mL    Comment:                     Adult Female:                       Follicular phase      3.5 -  12.5                       Ovulation phase       4.7 -  21.5  Luteal phase          1.7 -   7.7                       Postmenopausal       25.8 - 134.8    Prolactin 13.0 4.8 - 23.3 ng/mL   17-Hydroxyprogesterone 214 ng/dL    Comment:                           Adult Female                            Follicular        15 -  70                            Luteal            35 - 290    DHEA-SO4 357.0 84.8 - 378.0 ug/dL   Androstenedione 185 41 - 262 ng/dL    Comment: This test was  developed and its performance characteristics determined by [facility]. It has not been cleared or approved by the Food and Drug Administration.   Cytology - PAP     Status: Abnormal   Collection Time: 12/20/18  2:52 PM  Result Value Ref Range   High risk HPV Positive (A)    HPV 16 Negative    HPV 18 / 45 Negative    Adequacy      Satisfactory for evaluation; transformation zone component PRESENT.   Diagnosis      - Negative for intraepithelial lesion or malignancy (NILM)   Comment Normal Reference Range HPV - Negative    Comment Normal Reference Range HPV 16 18 45 -Negative    Comment Normal Reference Range HPV 16- Negative   Novel Coronavirus, NAA ([facility])     Status: None   Collection Time: 01/01/19 10:31 AM   Specimen: Nasopharyngeal(NP) swabs in vial transport medium   NASOPHARYNGE  TESTING  Result Value Ref Range   SARS-CoV-2, NAA Not Detected Not Detected    Comment: This nucleic acid amplification test was developed and its performance characteristics determined by [employer]. Nucleic acid amplification tests include PCR and TMA. This test has not been FDA cleared or approved. This test has been authorized by FDA under an Emergency Use Authorization (EUA). This test is only authorized for the duration of time the declaration that circumstances exist justifying the authorization of the emergency use of in vitro diagnostic tests for detection of SARS-CoV-2 virus and/or diagnosis of COVID-19 infection under section 564(b)(1) of the Act, 21 U.S.C. PT:2852782) (1), unless the authorization is terminated or revoked sooner. When diagnostic testing is negative, the possibility of a false negative result should be considered in the context of a patient's recent exposures and the presence of clinical signs and symptoms consistent with COVID-19. An individual without symptoms of COVID-19 and who is not shedding SARS-CoV-2 virus would  expect to have a negative (not  detected) result in this assay.    US Transvaginal Non-OB  Result Date: 01/20/2019 Patient Name: Jasmine Barnes DOB: [birth year] MRN: [id number] ULTRASOUND REPORT Location: [facility] Date of Service: 01/20/2019 Indications:Abnormal Uterine Bleeding Findings: The uterus is axial and measures 9.3 x 5.4 x 4.4 cm. Echo texture is homogenous with evidence of focal masses. Within the uterus is a single fibroid  measuring: Fibroid 1:24.5 x 18.8 x 26.7 mm subserosal posterior left The Endometrium measures 9.6 mm. Right Ovary measures 2.7 x 3.4 x 1.5 cm. It is normal in appearance. Left Ovary measures 2.6 x 2.0 x 1.5 cm. It is normal in appearance. Survey of the adnexa demonstrates no adnexal masses. There is no free fluid in the cul de sac. Impression: 1. There is one subserosal fibroid seen. The ultrasound is otherwise normal. Recommendations: 1.Clinical correlation with the patient's History and Physical Exam. Gweneth Dimitri, RT Images reviewed.  Normal GYN study without visualized pathology.  Malachy Mood, MD, [facility], [facility] 01/20/2019, 10:19 AM      Assessment: 34 y.o. follow up AUB Plan: Problem List Items Addressed This Visit    None    Visit Diagnoses    Acne vulgaris    -  Primary   Relevant Medications   tretinoin (RETIN-A) 0.025 % gel   PCOS (polycystic ovarian syndrome)       Abnormal uterine bleeding          1) AUB - based on labs, menstrual pattern, and ultrasound meets 2/3 [place] criteria for PCOS.  DIscused management option including po progestin, depo provera, or IUD.  Patient opts for IUD.  To call with next cycle for IUD placement  2) ACNE - rx retin-A gel  3) A total of 15 minutes were spent in face-to-face contact with the patient during this encounter with over half of that time devoted to counseling and coordination of care.  4) Return call with next menses mirena IUD insertion.    Malachy Mood, MD, [facility], [facility] 01/20/2019, 11:05 AM

## 2019-01-29 ENCOUNTER — Other Ambulatory Visit: Payer: Self-pay | Admitting: Obstetrics and Gynecology

## 2019-01-29 MED ORDER — FLUCONAZOLE 150 MG PO TABS: 150.0000 mg | ORAL_TABLET | Freq: Once | ORAL | 0 refills | Status: AC

## 2019-01-30 NOTE — Telephone Encounter (Signed)
Patient is returning

## 2019-01-30 NOTE — Telephone Encounter (Signed)
Pt states Rx needs a PA, says pharmacy faxed over papers , or different rx sent in please

## 2019-05-08 ENCOUNTER — Other Ambulatory Visit: Payer: BC Managed Care – PPO

## 2019-10-20 ENCOUNTER — Ambulatory Visit: Payer: BC Managed Care – PPO | Attending: Internal Medicine

## 2019-10-20 DIAGNOSIS — Z23 Encounter for immunization: Secondary | ICD-10-CM

## 2019-10-20 NOTE — Progress Notes (Signed)
   Covid-19 Vaccination Clinic  Name:  MCKYNZI CAMMON    MRN: [id number] DOB: 05-20-1985  10/20/2019  Ms. White-Whittingham was observed post Covid-19 immunization for 15 minutes without incident. She was provided with Vaccine Information Sheet and instruction to access the V-Safe system.   Ms. Haye was instructed to call 911 with any severe reactions post vaccine: Marland Kitchen Difficulty breathing  . Swelling of face and throat  . A fast heartbeat  . A bad rash all over body  . Dizziness and weakness   Immunizations Administered    Name Date Dose VIS Date Route   Pfizer COVID-19 Vaccine 10/20/2019 12:13 PM 0.3 mL 03/12/2018 Intramuscular   Manufacturer: [employer], [employer]   Lot: [id number]   [place]: [id number]

## 2020-03-29 ENCOUNTER — Emergency Department
Admission: EM | Admit: 2020-03-29 | Discharge: 2020-03-29 | Disposition: A | Discharge status: Home / Self Care | Payer: BC Managed Care – PPO | Attending: Emergency Medicine | Admitting: Emergency Medicine

## 2020-03-29 ENCOUNTER — Other Ambulatory Visit: Payer: Self-pay

## 2020-03-29 ENCOUNTER — Telehealth: Payer: Self-pay | Admitting: Physician Assistant

## 2020-03-29 ENCOUNTER — Emergency Department: Payer: BC Managed Care – PPO

## 2020-03-29 DIAGNOSIS — M436 Torticollis: Secondary | ICD-10-CM | POA: Insufficient documentation

## 2020-03-29 DIAGNOSIS — M542 Cervicalgia: Secondary | ICD-10-CM | POA: Diagnosis present

## 2020-03-29 MED ORDER — LIDOCAINE 5 % EX PTCH: 1.0000 | MEDICATED_PATCH | Freq: Two times a day (BID) | CUTANEOUS | 0 refills | Status: AC

## 2020-03-29 MED ORDER — ORPHENADRINE CITRATE ER 100 MG PO TB12: 100.0000 mg | ORAL_TABLET | Freq: Two times a day (BID) | ORAL | 1 refills | Status: AC

## 2020-03-29 MED ORDER — NAPROXEN 500 MG PO TABS: 500.0000 mg | ORAL_TABLET | Freq: Two times a day (BID) | ORAL | Status: DC

## 2020-03-29 MED ORDER — TRAMADOL HCL 50 MG PO TABS: 50.0000 mg | ORAL_TABLET | Freq: Four times a day (QID) | ORAL | 0 refills | Status: DC | PRN

## 2020-03-29 MED ORDER — ORPHENADRINE CITRATE 30 MG/ML IJ SOLN
60.0000 mg | Freq: Two times a day (BID) | INTRAMUSCULAR | Status: DC
  Administered 2020-03-29: 60 mg via INTRAMUSCULAR
  Filled 2020-03-29: qty 2

## 2020-03-29 MED ORDER — HYDROMORPHONE HCL 1 MG/ML IJ SOLN
1.0000 mg | Freq: Once | INTRAMUSCULAR | Status: AC
  Administered 2020-03-29: 1 mg via INTRAMUSCULAR
  Filled 2020-03-29: qty 1

## 2020-03-29 NOTE — ED Provider Notes (Signed)
[facility] Emergency Department Provider Note   ____________________________________________   Event Date/Time   First MD Initiated Contact with Patient 03/29/20 0815     (approximate)  I have reviewed the triage vital signs and the nursing notes.   HISTORY  Chief Complaint Neck Pain    HPI Jasmine Barnes is a 35 y.o. female patient presents with neck pain which occurred last night while sleeping.  Patient denies any other provocative incident for complaint.  Patient denies radicular component to pain.  Patient did pain increased with any neck movement.  Patient has full  range of motion of the upper extremities.  Patient rates pain as a 10/10.  Patient described pain as "shooting".  No palliative measure for complaint.         Past Medical History:  Diagnosis Date  . Anxiety   . Bipolar disorder ([place])   . Depression   . Kidney stone     Patient Active Problem List   Diagnosis Date Noted  . Cephalalgia 03/16/2014    Past Surgical History:  Procedure Laterality Date  . HERNIA REPAIR    . LITHOTRIPSY     x 2  . TUBAL LIGATION      Prior to Admission medications   Medication Sig Start Date End Date Taking? Authorizing Provider  lidocaine (LIDODERM) 5 % Place 1 patch onto the skin every 12 (twelve) hours. Remove & Discard patch within 12 hours or as directed by MD 03/29/20 03/29/21 Yes Sable Feil, PA-C  naproxen (NAPROSYN) 500 MG tablet Take 1 tablet (500 mg total) by mouth 2 (two) times daily with a meal. 03/29/20  Yes Sable Feil, PA-C  orphenadrine (NORFLEX) 100 MG tablet Take 1 tablet (100 mg total) by mouth 2 (two) times daily. 03/29/20 03/29/21 Yes Sable Feil, PA-C  ALPRAZolam Duanne Moron) 0.5 MG tablet TAKE 1/2 TABLET BY MOUTH EVERY 8 HOURS AS NEEDED FOR ELEVATED ANXIETY 03/07/18   [provider]  amLODipine (NORVASC) 2.5 MG tablet TAKE 1 TABLET BY MOUTH DAILY IN TH MORING FOR BP 04/30/18   [provider]   Ascorbic Acid (VITAMIN C) 100 MG tablet Take 100 mg by mouth daily.    [provider]  fluticasone (FLONASE) 50 MCG/ACT nasal spray Place 1 spray into both nostrils 2 (two) times daily. 04/19/18   [provider]  lisdexamfetamine (VYVANSE) 30 MG capsule Take 30 mg by mouth daily.    [provider]  Magnesium 250 MG TABS Take by mouth.    [provider]  metoprolol succinate (TOPROL-XL) 50 MG 24 hr tablet  04/19/18   [provider]  tretinoin (RETIN-A) 0.025 % gel Apply topically at bedtime. 01/20/19   Malachy Mood, MD  Vitamin D, Ergocalciferol, (DRISDOL) 50000 units CAPS capsule Take 50,000 Units by mouth every 7 (seven) days.    [provider]    Allergies Penicillin g  Family History  Problem Relation Age of Onset  . Alcohol abuse Mother   . Drug abuse Mother   . Bipolar disorder Mother   . Schizophrenia Father   . Alcohol abuse Father   . Drug abuse Father   . Kidney disease Neg Hx   . Bladder Cancer Neg Hx     Social History Social History   Tobacco Use  . Smoking status: Never Smoker  . Smokeless tobacco: Never Used  Substance Use Topics  . Alcohol use: Yes    Alcohol/week: 2.0 - 3.0 standard drinks  Types: 2 - 3 Standard drinks or equivalent per week    Comment: wine, mixed drinks, occasional beer  . Drug use: No    Comment: quit using marijuana 3 years ago    Review of Systems Constitutional: No fever/chills Eyes: No visual changes. ENT: No sore throat. Cardiovascular: Denies chest pain. Respiratory: Denies shortness of breath. Gastrointestinal: No abdominal pain.  No nausea, no vomiting.  No diarrhea.  No constipation. Genitourinary: Negative for dysuria. Musculoskeletal: Negative for back pain. Skin: Negative for rash. Neurological: Negative for headaches, focal weakness or numbness. Psychiatric:  Anxiety, bipolar, depression. Endocrine:  Hypertension Allergic/Immunilogical: Penicillin  ____________________________________________   PHYSICAL EXAM:  VITAL SIGNS: ED Triage Vitals  Enc Vitals Group     BP 03/29/20 0800 (!) 173/94     Pulse Rate 03/29/20 0800 62     Resp 03/29/20 0800 18     Temp 03/29/20 0800 97.8 F (36.6 C)     Temp Source 03/29/20 0800 Oral     SpO2 03/29/20 0800 100 %     Weight 03/29/20 0801 203 lb (92.1 kg)     Height 03/29/20 0801 5\' 2"  (1.575 m)     Head Circumference --      Peak Flow --      Pain Score 03/29/20 0800 10     Pain Loc --      Pain Edu? --      Excl. in [place]? --     Constitutional: Alert and oriented.  Moderate distress.   Neck: No stridor.  Posterior cervical spine tenderness to palpation. Hematological/Lymphatic/Immunilogical: No cervical lymphadenopathy. Cardiovascular: Normal rate, regular rhythm. Grossly normal heart sounds.  Good peripheral circulation.  Elevated blood pressure.  Patient did not take blood pressure medication this morning. Respiratory: Normal respiratory effort.  No retractions. Lungs CTAB. Genitourinary: Deferred Musculoskeletal: No lower extremity tenderness nor edema.  No joint effusions. Neurologic:  Normal speech and language. No gross focal neurologic deficits are appreciated. No gait instability. Skin:  Skin is warm, dry and intact. No rash noted. Psychiatric: Mood and affect are normal. Speech and behavior are normal.  ____________________________________________   LABS (all labs ordered are listed, but only abnormal results are displayed)  Labs Reviewed - No data to display ____________________________________________  EKG   ____________________________________________  RADIOLOGY I, Sable Feil, personally viewed and evaluated these images (plain radiographs) as part of my medical decision making, as well as reviewing the written report by the radiologist.  ED MD interpretation:    Official radiology report(s): DG Cervical Spine Complete  Result Date: 03/29/2020 CLINICAL  DATA:  Acute neck pain without known injury. EXAM: CERVICAL SPINE - COMPLETE 4+ VIEW COMPARISON:  None. FINDINGS: There is no evidence of cervical spine fracture or prevertebral soft tissue swelling. Alignment is normal. No other significant bone abnormalities are identified. No significant neural foraminal stenosis. IMPRESSION: Negative cervical spine radiographs. Electronically Signed   By: Marijo Conception M.D.   On: 03/29/2020 09:59    ____________________________________________   PROCEDURES  Procedure(s) performed (including Critical Care):  Procedures   ____________________________________________   INITIAL IMPRESSION / ASSESSMENT AND PLAN / ED COURSE  As part of my medical decision making, I reviewed the following data within the [place]         Patient presents with acute neck pain with decreased range of motion from the a.m. awakening.  Discussed no acute findings on cervical spine x-ray.  Patient complaint physical exam consistent with torticollis.  Patient  given discharge care instruction advised take medications as directed.  Return to ED if condition does not improve or worsens in the next 72 hours.      ____________________________________________   FINAL CLINICAL IMPRESSION(S) / ED DIAGNOSES  Final diagnoses:  Torticollis, acute     ED Discharge Orders         Ordered    orphenadrine (NORFLEX) 100 MG tablet  2 times daily        03/29/20 1020    naproxen (NAPROSYN) 500 MG tablet  2 times daily with meals        03/29/20 1020    lidocaine (LIDODERM) 5 %  Every 12 hours        03/29/20 1021          *Please note:  Jasmine Barnes was evaluated in Emergency Department on 03/29/2020 for the symptoms described in the history of present illness. She was evaluated in the context of the global COVID-19 pandemic, which necessitated consideration that the patient might be at risk for infection with the SARS-CoV-2 virus that causes  COVID-19. Institutional protocols and algorithms that pertain to the evaluation of patients at risk for COVID-19 are in a state of rapid change based on information released by regulatory bodies including the CDC and federal and state organizations. These policies and algorithms were followed during the patient's care in the ED.  Some ED evaluations and interventions may be delayed as a result of limited staffing during and the pandemic.*   Note:  This document was prepared using Dragon voice recognition software and may include unintentional dictation errors.    Sable Feil, PA-C 03/29/20 [place], MD 03/31/20 [phone]

## 2020-03-29 NOTE — Discharge Instructions (Signed)
No acute findings on x-rays of the cervical spine.  Follow discharge care instruction take medications as directed.

## 2020-03-29 NOTE — ED Triage Notes (Signed)
Pt c/o shooting neck pain that started yesterday, denies any injury. States it hurts to move at all.

## 2020-03-29 NOTE — ED Notes (Signed)
See triage note  Presents with neck pain  States pain started in head and moved to back of neck  This am neck pain is worse   Increased pain with movement  No fever

## 2020-06-03 ENCOUNTER — Ambulatory Visit: Payer: BC Managed Care – PPO | Admitting: Cardiology

## 2020-07-22 ENCOUNTER — Other Ambulatory Visit (HOSPITAL_COMMUNITY): Payer: Self-pay | Admitting: Nurse Practitioner

## 2020-07-22 ENCOUNTER — Other Ambulatory Visit: Payer: Self-pay | Admitting: Nurse Practitioner

## 2020-07-22 DIAGNOSIS — R200 Anesthesia of skin: Secondary | ICD-10-CM

## 2020-07-22 DIAGNOSIS — R202 Paresthesia of skin: Secondary | ICD-10-CM

## 2020-07-22 DIAGNOSIS — M5412 Radiculopathy, cervical region: Secondary | ICD-10-CM

## 2020-07-29 ENCOUNTER — Ambulatory Visit: Payer: BC Managed Care – PPO

## 2020-08-02 ENCOUNTER — Ambulatory Visit: Admission: RE | Admit: 2020-08-02 | Payer: BC Managed Care – PPO | Source: Ambulatory Visit

## 2020-09-23 ENCOUNTER — Ambulatory Visit: Admission: RE | Admit: 2020-09-23 | Payer: BC Managed Care – PPO | Source: Ambulatory Visit

## 2021-10-26 ENCOUNTER — Other Ambulatory Visit (HOSPITAL_COMMUNITY): Payer: Self-pay

## 2021-10-26 MED ORDER — LISDEXAMFETAMINE DIMESYLATE 30 MG PO CAPS
30.0000 mg | ORAL_CAPSULE | Freq: Every morning | ORAL | 0 refills | Status: DC
  Filled 2021-10-26: qty 30, 30d supply, fill #0

## 2021-10-27 ENCOUNTER — Other Ambulatory Visit (HOSPITAL_COMMUNITY): Payer: Self-pay

## 2021-10-27 ENCOUNTER — Other Ambulatory Visit: Payer: Self-pay

## 2021-10-27 MED ORDER — TAMSULOSIN HCL 0.4 MG PO CAPS
0.4000 mg | ORAL_CAPSULE | Freq: Every day | ORAL | 0 refills | Status: DC
  Filled 2021-10-27: qty 5, 5d supply, fill #0

## 2021-11-02 ENCOUNTER — Other Ambulatory Visit (HOSPITAL_COMMUNITY): Payer: Self-pay

## 2021-11-02 MED ORDER — LISDEXAMFETAMINE DIMESYLATE 30 MG PO CAPS
30.0000 mg | ORAL_CAPSULE | Freq: Every morning | ORAL | 0 refills | Status: DC
  Filled 2021-12-02: qty 30, 30d supply, fill #0

## 2021-11-02 MED ORDER — PROPRANOLOL HCL 10 MG PO TABS
10.0000 mg | ORAL_TABLET | Freq: Every day | ORAL | 1 refills | Status: DC
  Filled 2021-11-02: qty 30, 30d supply, fill #0

## 2021-11-02 MED ORDER — CITALOPRAM HYDROBROMIDE 10 MG PO TABS
10.0000 mg | ORAL_TABLET | Freq: Every day | ORAL | 1 refills | Status: DC
  Filled 2021-11-02: qty 30, 30d supply, fill #0

## 2021-11-04 ENCOUNTER — Emergency Department
Admission: EM | Admit: 2021-11-04 | Discharge: 2021-11-05 | Disposition: A | Discharge status: Home / Self Care | Payer: 59 | Attending: Emergency Medicine | Admitting: Emergency Medicine

## 2021-11-04 ENCOUNTER — Other Ambulatory Visit (HOSPITAL_COMMUNITY): Payer: Self-pay

## 2021-11-04 DIAGNOSIS — Z79899 Other long term (current) drug therapy: Secondary | ICD-10-CM | POA: Diagnosis not present

## 2021-11-04 DIAGNOSIS — I10 Essential (primary) hypertension: Secondary | ICD-10-CM | POA: Diagnosis present

## 2021-11-05 ENCOUNTER — Encounter: Payer: Self-pay | Admitting: Emergency Medicine

## 2021-11-05 ENCOUNTER — Other Ambulatory Visit (HOSPITAL_COMMUNITY): Payer: Self-pay

## 2021-11-05 ENCOUNTER — Emergency Department: Payer: 59

## 2021-11-05 DIAGNOSIS — I10 Essential (primary) hypertension: Secondary | ICD-10-CM | POA: Diagnosis not present

## 2021-11-05 LAB — CBC
HCT: 35.5 % — ABNORMAL LOW (ref 36.0–46.0)
Hemoglobin: 11.7 g/dL — ABNORMAL LOW (ref 12.0–15.0)
MCH: 28.1 pg (ref 26.0–34.0)
MCHC: 33 g/dL (ref 30.0–36.0)
MCV: 85.1 fL (ref 80.0–100.0)
Platelets: 248 K/uL (ref 150–400)
RBC: 4.17 MIL/uL (ref 3.87–5.11)
RDW: 14.6 % (ref 11.5–15.5)
WBC: 8.3 K/uL (ref 4.0–10.5)
nRBC: 0 % (ref 0.0–0.2)

## 2021-11-05 LAB — BASIC METABOLIC PANEL
Anion gap: 7 (ref 5–15)
BUN: 15 mg/dL (ref 6–20)
CO2: 23 mmol/L (ref 22–32)
Calcium: 9.1 mg/dL (ref 8.9–10.3)
Chloride: 105 mmol/L (ref 98–111)
Creatinine, Ser: 0.83 mg/dL (ref 0.44–1.00)
GFR, Estimated: >60 mL/min (ref >60)
Glucose, Bld: 104 mg/dL — ABNORMAL HIGH (ref 70–99)
Potassium: 3.4 mmol/L — ABNORMAL LOW (ref 3.5–5.1)
Sodium: 135 mmol/L (ref 135–145)

## 2021-11-05 LAB — TROPONIN I (HIGH SENSITIVITY): Troponin I (High Sensitivity): 5 ng/L (ref <18)

## 2021-11-05 MED ORDER — HYDRALAZINE HCL 25 MG PO TABS
25.0000 mg | ORAL_TABLET | Freq: Three times a day (TID) | ORAL | 1 refills | Status: DC
  Filled 2021-11-05: qty 120, 40d supply, fill #0

## 2021-11-05 NOTE — ED Notes (Signed)
E-signature pad unavailable - Pt verbalized understanding of D/C information - no additional concerns at this time.  

## 2021-11-05 NOTE — ED Provider Notes (Signed)
[facility] Provider Note    Event Date/Time   First MD Initiated Contact with Patient 11/05/21 [phone]     (approximate)   History   Hypertension   HPI  Jasmine Barnes is a 36 y.o. female who presents to the ED for evaluation of Hypertension   Patient presents to the ED for evaluation of hypertension.  She reports consistently taking her metoprolol and losartan without recent medication changes.  Reports over the past 3 to 4 days she has had higher blood pressures at home for no apparent reason.  Reports systolics up to the 034V.  Denies any symptoms such as headache, chest pain, dyspnea, syncope, seizure or falls.  Reports having a PCP visit coming up with her primary NP this coming Thursday.  She has seen cardiology in the past, Dr. Humphrey Rolls , and reports that she can reach back out to him for another appointment.  She was concerned that she does not have a clinic visit for nearly a week and she was unsure if having blood pressures this high for this long would be an issue.  No symptoms.   Physical Exam   Triage Vital Signs: ED Triage Vitals  Enc Vitals Group     BP 11/05/21 0008 (!) 236/110     Pulse Rate 11/05/21 0008 75     Resp 11/05/21 0008 18     Temp 11/05/21 0006 98.3 F (36.8 C)     Temp Source 11/05/21 0006 Oral     SpO2 11/05/21 0008 100 %     Weight 11/05/21 0006 200 lb (90.7 kg)     Height 11/05/21 0006 '5\' 2"'$  (1.575 m)     Head Circumference --      Peak Flow --      Pain Score 11/05/21 0006 0     Pain Loc --      Pain Edu? --      Excl. in [place]? --     Most recent vital signs: Vitals:   11/05/21 0417 11/05/21 0528  BP: (!) 195/103 (!) 186/100  Pulse: 63 62  Resp: 18 18  Temp: 98.2 F (36.8 C)   SpO2: 100% 100%    General: Awake, no distress.  CV:  Good peripheral perfusion.  Resp:  Normal effort.  Abd:  No distention.  MSK:  No deformity noted.  Neuro:  No focal deficits appreciated. Cranial nerves II through XII  intact 5/5 strength and sensation in all 4 extremities Other:     ED Results / Procedures / Treatments   Labs (all labs ordered are listed, but only abnormal results are displayed) Labs Reviewed  BASIC METABOLIC PANEL - Abnormal; Notable for the following components:      Result Value   Potassium 3.4 (*)    Glucose, Bld 104 (*)    All other components within normal limits  CBC - Abnormal; Notable for the following components:   Hemoglobin 11.7 (*)    HCT 35.5 (*)    All other components within normal limits  POC URINE PREG, ED  TROPONIN I (HIGH SENSITIVITY)  TROPONIN I (HIGH SENSITIVITY)    EKG Sinus rhythm with a rate of 69 bpm.  Normal axis and intervals.  No signs of acute ischemia.  RADIOLOGY CXR interpreted by me without evidence of acute cardiopulmonary pathology.  Official radiology report(s): DG Chest 2 View  Result Date: 11/05/2021 CLINICAL DATA:  Hypertension. EXAM: CHEST - 2 VIEW COMPARISON:  Chest radiograph dated 10/11/2016  FINDINGS: No focal consolidation, pleural effusion or pneumothorax. The cardiac silhouette is within normal limits. No acute osseous pathology. IMPRESSION: No active cardiopulmonary disease. Electronically Signed   By: Anner Crete M.D.   On: 11/05/2021 00:47    PROCEDURES and INTERVENTIONS:  Procedures  Medications - No data to display   IMPRESSION / MDM / [place] / ED COURSE  I reviewed the triage vital signs and the nursing notes.  Differential diagnosis includes, but is not limited to, stroke, essential HTN, end organ damage  {Patient presents with symptoms of an acute illness or injury that is potentially life-threatening.  36 year old patient presents with asymptomatic hypertension.  No signs of endorgan damage.  She is asymptomatic.  Screening blood work with no renal dysfunction.  Negative troponin and normal WBC.  EKG is nonischemic.  I urged her to continue her antihypertensive regimen and I added a low-dose  of hydralazine alongside this until she can follow-up with her PCP and cardiologist.  We discussed return precautions.      FINAL CLINICAL IMPRESSION(S) / ED DIAGNOSES   Final diagnoses:  Uncontrolled hypertension  Asymptomatic hypertension     Rx / DC Orders   ED Discharge Orders          Ordered    hydrALAZINE (APRESOLINE) 25 MG tablet  3 times daily        11/05/21 0518             Note:  This document was prepared using Dragon voice recognition software and may include unintentional dictation errors.   Vladimir Crofts, MD 11/05/21 [phone]

## 2021-11-05 NOTE — ED Notes (Signed)
Pt declined second trop

## 2021-11-05 NOTE — ED Triage Notes (Signed)
Pt presents via POV with complaints of HTN - hx of same. States her BP was 210/111 PTA - complaint with medications. Last took meds around 1700 last night. Pt denies any sx no blurred vision, SOB, CP, headache, numbness, tingling.

## 2021-11-08 ENCOUNTER — Other Ambulatory Visit (HOSPITAL_COMMUNITY): Payer: Self-pay

## 2021-11-08 MED ORDER — SPIRONOLACTONE 25 MG PO TABS
25.0000 mg | ORAL_TABLET | Freq: Every day | ORAL | 0 refills | Status: DC
  Filled 2021-11-08: qty 30, 30d supply, fill #0

## 2021-11-11 ENCOUNTER — Other Ambulatory Visit (HOSPITAL_COMMUNITY)
Admission: RE | Admit: 2021-11-11 | Discharge: 2021-11-11 | Disposition: A | Discharge status: Home / Self Care | Payer: 59 | Source: Ambulatory Visit | Attending: Cardiology | Admitting: Cardiology

## 2021-11-11 ENCOUNTER — Other Ambulatory Visit (HOSPITAL_COMMUNITY): Payer: Self-pay

## 2021-11-11 ENCOUNTER — Other Ambulatory Visit: Payer: Self-pay | Admitting: Cardiology

## 2021-11-11 DIAGNOSIS — I158 Other secondary hypertension: Secondary | ICD-10-CM

## 2021-11-11 MED ORDER — CLONIDINE HCL 0.1 MG PO TABS
ORAL_TABLET | ORAL | 0 refills | Status: DC
  Filled 2021-11-11: qty 30, 30d supply, fill #0

## 2021-11-11 MED ORDER — CITALOPRAM HYDROBROMIDE 20 MG PO TABS
20.0000 mg | ORAL_TABLET | Freq: Every day | ORAL | 0 refills | Status: DC
  Filled 2021-11-11: qty 30, 30d supply, fill #0

## 2021-11-14 ENCOUNTER — Other Ambulatory Visit (HOSPITAL_COMMUNITY): Payer: Self-pay

## 2021-11-16 ENCOUNTER — Other Ambulatory Visit (HOSPITAL_COMMUNITY): Payer: Self-pay

## 2021-11-16 MED ORDER — LOSARTAN POTASSIUM 50 MG PO TABS
50.0000 mg | ORAL_TABLET | Freq: Every day | ORAL | 1 refills | Status: DC
  Filled 2021-11-16: qty 90, 90d supply, fill #0
  Filled 2022-03-27 (×3): qty 90, 90d supply, fill #1

## 2021-11-17 ENCOUNTER — Other Ambulatory Visit (HOSPITAL_COMMUNITY): Payer: Self-pay

## 2021-11-17 LAB — CATECHOLAMINES, FRACTIONATED, PLASMA
Dopamine: <30 pg/mL (ref 0–48)
Epinephrine: 31 pg/mL (ref 0–62)
Norepinephrine: 585 pg/mL (ref 0–874)

## 2021-11-28 ENCOUNTER — Other Ambulatory Visit (HOSPITAL_COMMUNITY): Payer: Self-pay

## 2021-11-28 MED ORDER — CHLORTHALIDONE 25 MG PO TABS
25.0000 mg | ORAL_TABLET | Freq: Every day | ORAL | 2 refills | Status: DC
  Filled 2021-11-28: qty 30, 30d supply, fill #0
  Filled 2022-01-25: qty 30, 30d supply, fill #1
  Filled 2022-03-18 – 2022-03-27 (×2): qty 30, 30d supply, fill #2

## 2021-11-29 ENCOUNTER — Other Ambulatory Visit (HOSPITAL_COMMUNITY): Payer: Self-pay

## 2021-11-29 MED ORDER — ROSUVASTATIN CALCIUM 20 MG PO TABS
20.0000 mg | ORAL_TABLET | Freq: Every evening | ORAL | 0 refills | Status: DC
  Filled 2021-11-29: qty 90, 90d supply, fill #0

## 2021-11-29 MED ORDER — ERGOCALCIFEROL 1.25 MG (50000 UT) PO CAPS
1.0000 | ORAL_CAPSULE | ORAL | 0 refills | Status: DC
  Filled 2021-11-29: qty 12, 84d supply, fill #0

## 2021-12-02 ENCOUNTER — Other Ambulatory Visit (HOSPITAL_COMMUNITY): Payer: Self-pay

## 2021-12-07 ENCOUNTER — Other Ambulatory Visit (HOSPITAL_COMMUNITY): Payer: Self-pay

## 2021-12-07 MED ORDER — METOPROLOL SUCCINATE ER 100 MG PO TB24
100.0000 mg | ORAL_TABLET | Freq: Every day | ORAL | 2 refills | Status: DC
  Filled 2021-12-07: qty 90, 90d supply, fill #0

## 2021-12-09 ENCOUNTER — Other Ambulatory Visit (HOSPITAL_COMMUNITY): Payer: Self-pay

## 2021-12-12 ENCOUNTER — Other Ambulatory Visit (HOSPITAL_COMMUNITY): Payer: Self-pay

## 2021-12-12 ENCOUNTER — Other Ambulatory Visit (HOSPITAL_BASED_OUTPATIENT_CLINIC_OR_DEPARTMENT_OTHER): Payer: Self-pay

## 2021-12-12 MED ORDER — SPIRONOLACTONE 25 MG PO TABS
25.0000 mg | ORAL_TABLET | Freq: Every day | ORAL | 0 refills | Status: DC
  Filled 2021-12-12: qty 90, 90d supply, fill #0

## 2021-12-13 ENCOUNTER — Other Ambulatory Visit (HOSPITAL_COMMUNITY): Payer: Self-pay

## 2021-12-21 ENCOUNTER — Other Ambulatory Visit (HOSPITAL_COMMUNITY): Payer: Self-pay

## 2021-12-21 MED ORDER — LISDEXAMFETAMINE DIMESYLATE 30 MG PO CAPS
30.0000 mg | ORAL_CAPSULE | Freq: Every morning | ORAL | 0 refills | Status: DC
  Filled 2022-01-13: qty 30, 30d supply, fill #0

## 2021-12-21 MED ORDER — CLONIDINE HCL 0.1 MG PO TABS
0.1000 mg | ORAL_TABLET | Freq: Every day | ORAL | 0 refills | Status: DC
  Filled 2021-12-21: qty 30, 30d supply, fill #0

## 2021-12-29 ENCOUNTER — Other Ambulatory Visit (HOSPITAL_COMMUNITY): Payer: Self-pay

## 2021-12-29 MED ORDER — FLUCONAZOLE 150 MG PO TABS
ORAL_TABLET | ORAL | 0 refills | Status: DC
  Filled 2021-12-29: qty 2, 5d supply, fill #0

## 2021-12-29 MED ORDER — NYSTATIN 100000 UNIT/GM EX CREA
TOPICAL_CREAM | CUTANEOUS | 2 refills | Status: DC
  Filled 2021-12-29: qty 15, 20d supply, fill #0

## 2022-01-03 ENCOUNTER — Other Ambulatory Visit (HOSPITAL_COMMUNITY): Payer: Self-pay

## 2022-01-11 ENCOUNTER — Other Ambulatory Visit: Payer: Self-pay | Admitting: Cardiology

## 2022-01-11 DIAGNOSIS — R7303 Prediabetes: Secondary | ICD-10-CM

## 2022-01-11 DIAGNOSIS — R5383 Other fatigue: Secondary | ICD-10-CM

## 2022-01-11 DIAGNOSIS — E782 Mixed hyperlipidemia: Secondary | ICD-10-CM

## 2022-01-13 ENCOUNTER — Other Ambulatory Visit (HOSPITAL_COMMUNITY): Payer: Self-pay

## 2022-01-23 DIAGNOSIS — I340 Nonrheumatic mitral (valve) insufficiency: Secondary | ICD-10-CM | POA: Diagnosis not present

## 2022-01-23 DIAGNOSIS — E669 Obesity, unspecified: Secondary | ICD-10-CM | POA: Diagnosis not present

## 2022-01-23 DIAGNOSIS — R0602 Shortness of breath: Secondary | ICD-10-CM | POA: Diagnosis not present

## 2022-01-23 DIAGNOSIS — R55 Syncope and collapse: Secondary | ICD-10-CM | POA: Diagnosis not present

## 2022-01-23 DIAGNOSIS — I10 Essential (primary) hypertension: Secondary | ICD-10-CM | POA: Diagnosis not present

## 2022-01-23 DIAGNOSIS — I351 Nonrheumatic aortic (valve) insufficiency: Secondary | ICD-10-CM | POA: Diagnosis not present

## 2022-01-23 DIAGNOSIS — E785 Hyperlipidemia, unspecified: Secondary | ICD-10-CM | POA: Diagnosis not present

## 2022-01-23 DIAGNOSIS — R002 Palpitations: Secondary | ICD-10-CM | POA: Diagnosis not present

## 2022-01-25 ENCOUNTER — Other Ambulatory Visit (HOSPITAL_COMMUNITY): Payer: Self-pay

## 2022-01-25 MED ORDER — TRIAZOLAM 0.25 MG PO TABS
0.2500 mg | ORAL_TABLET | ORAL | 0 refills | Status: DC
  Filled 2022-01-25: qty 1, 1d supply, fill #0

## 2022-01-25 MED ORDER — IBUPROFEN 800 MG PO TABS
800.0000 mg | ORAL_TABLET | Freq: Three times a day (TID) | ORAL | 0 refills | Status: DC | PRN
  Filled 2022-01-25: qty 30, 10d supply, fill #0

## 2022-01-26 ENCOUNTER — Other Ambulatory Visit: Payer: Self-pay

## 2022-01-27 ENCOUNTER — Other Ambulatory Visit (HOSPITAL_COMMUNITY): Payer: Self-pay

## 2022-01-27 DIAGNOSIS — I10 Essential (primary) hypertension: Secondary | ICD-10-CM | POA: Diagnosis not present

## 2022-01-27 DIAGNOSIS — R0602 Shortness of breath: Secondary | ICD-10-CM | POA: Diagnosis not present

## 2022-01-27 DIAGNOSIS — R7301 Impaired fasting glucose: Secondary | ICD-10-CM | POA: Diagnosis not present

## 2022-01-27 DIAGNOSIS — E6609 Other obesity due to excess calories: Secondary | ICD-10-CM | POA: Diagnosis not present

## 2022-01-27 DIAGNOSIS — R5383 Other fatigue: Secondary | ICD-10-CM | POA: Diagnosis not present

## 2022-01-27 DIAGNOSIS — N760 Acute vaginitis: Secondary | ICD-10-CM | POA: Diagnosis not present

## 2022-01-27 DIAGNOSIS — F908 Attention-deficit hyperactivity disorder, other type: Secondary | ICD-10-CM | POA: Diagnosis not present

## 2022-01-27 DIAGNOSIS — E785 Hyperlipidemia, unspecified: Secondary | ICD-10-CM | POA: Diagnosis not present

## 2022-01-27 DIAGNOSIS — R002 Palpitations: Secondary | ICD-10-CM | POA: Diagnosis not present

## 2022-01-27 MED ORDER — FLUCONAZOLE 150 MG PO TABS
ORAL_TABLET | ORAL | 0 refills | Status: DC
  Filled 2022-01-27: qty 2, 5d supply, fill #0

## 2022-01-27 MED ORDER — ZEPBOUND 2.5 MG/0.5ML ~~LOC~~ SOAJ
SUBCUTANEOUS | 1 refills | Status: DC
  Filled 2022-01-27: qty 2, 30d supply, fill #0
  Filled 2022-02-15: qty 2, 28d supply, fill #0

## 2022-01-30 DIAGNOSIS — E669 Obesity, unspecified: Secondary | ICD-10-CM | POA: Diagnosis not present

## 2022-01-30 DIAGNOSIS — R002 Palpitations: Secondary | ICD-10-CM | POA: Diagnosis not present

## 2022-01-30 DIAGNOSIS — I10 Essential (primary) hypertension: Secondary | ICD-10-CM | POA: Diagnosis not present

## 2022-01-30 DIAGNOSIS — I340 Nonrheumatic mitral (valve) insufficiency: Secondary | ICD-10-CM | POA: Diagnosis not present

## 2022-01-30 DIAGNOSIS — I351 Nonrheumatic aortic (valve) insufficiency: Secondary | ICD-10-CM | POA: Diagnosis not present

## 2022-01-30 DIAGNOSIS — E785 Hyperlipidemia, unspecified: Secondary | ICD-10-CM | POA: Diagnosis not present

## 2022-02-03 ENCOUNTER — Other Ambulatory Visit (HOSPITAL_COMMUNITY): Payer: Self-pay

## 2022-02-03 DIAGNOSIS — F4310 Post-traumatic stress disorder, unspecified: Secondary | ICD-10-CM | POA: Diagnosis not present

## 2022-02-03 DIAGNOSIS — F909 Attention-deficit hyperactivity disorder, unspecified type: Secondary | ICD-10-CM | POA: Diagnosis not present

## 2022-02-03 DIAGNOSIS — F419 Anxiety disorder, unspecified: Secondary | ICD-10-CM | POA: Diagnosis not present

## 2022-02-03 MED ORDER — ATOMOXETINE HCL 40 MG PO CAPS
40.0000 mg | ORAL_CAPSULE | Freq: Every day | ORAL | 0 refills | Status: DC
  Filled 2022-02-03: qty 15, 15d supply, fill #0

## 2022-02-08 ENCOUNTER — Other Ambulatory Visit: Payer: Self-pay

## 2022-02-10 ENCOUNTER — Other Ambulatory Visit (HOSPITAL_COMMUNITY): Payer: Self-pay

## 2022-02-10 DIAGNOSIS — R002 Palpitations: Secondary | ICD-10-CM | POA: Diagnosis not present

## 2022-02-15 ENCOUNTER — Other Ambulatory Visit (HOSPITAL_COMMUNITY): Payer: Self-pay

## 2022-02-15 DIAGNOSIS — F4310 Post-traumatic stress disorder, unspecified: Secondary | ICD-10-CM | POA: Diagnosis not present

## 2022-02-15 DIAGNOSIS — F419 Anxiety disorder, unspecified: Secondary | ICD-10-CM | POA: Diagnosis not present

## 2022-02-15 DIAGNOSIS — F909 Attention-deficit hyperactivity disorder, unspecified type: Secondary | ICD-10-CM | POA: Diagnosis not present

## 2022-02-15 MED ORDER — CLONIDINE HCL 0.1 MG PO TABS
ORAL_TABLET | ORAL | 0 refills | Status: DC
  Filled 2022-02-15: qty 30, 30d supply, fill #0

## 2022-02-15 MED ORDER — ATOMOXETINE HCL 40 MG PO CAPS
40.0000 mg | ORAL_CAPSULE | Freq: Every day | ORAL | 0 refills | Status: DC
  Filled 2022-02-15: qty 30, 30d supply, fill #0

## 2022-02-15 MED ORDER — CLONAZEPAM 0.5 MG PO TABS
ORAL_TABLET | ORAL | 0 refills | Status: DC
  Filled 2022-02-15: qty 30, 60d supply, fill #0

## 2022-02-16 ENCOUNTER — Other Ambulatory Visit (HOSPITAL_COMMUNITY): Payer: Self-pay

## 2022-02-17 ENCOUNTER — Encounter: Payer: Self-pay | Admitting: Nurse Practitioner

## 2022-03-07 ENCOUNTER — Other Ambulatory Visit (HOSPITAL_COMMUNITY): Payer: Self-pay

## 2022-03-18 ENCOUNTER — Encounter: Payer: Self-pay | Admitting: Nurse Practitioner

## 2022-03-27 ENCOUNTER — Other Ambulatory Visit (HOSPITAL_COMMUNITY): Payer: Self-pay

## 2022-03-27 ENCOUNTER — Encounter: Payer: Self-pay | Admitting: Cardiovascular Disease

## 2022-03-27 ENCOUNTER — Ambulatory Visit (INDEPENDENT_AMBULATORY_CARE_PROVIDER_SITE_OTHER): Payer: 59 | Admitting: Cardiovascular Disease

## 2022-03-27 ENCOUNTER — Encounter: Payer: Self-pay | Admitting: Obstetrics and Gynecology

## 2022-03-27 VITALS — BP 158/104 | HR 74 | Ht 63.0 in | Wt 207.0 lb

## 2022-03-27 DIAGNOSIS — R0602 Shortness of breath: Secondary | ICD-10-CM

## 2022-03-27 DIAGNOSIS — R0789 Other chest pain: Secondary | ICD-10-CM | POA: Diagnosis not present

## 2022-03-27 DIAGNOSIS — I10 Essential (primary) hypertension: Secondary | ICD-10-CM | POA: Diagnosis not present

## 2022-03-27 DIAGNOSIS — Z8679 Personal history of other diseases of the circulatory system: Secondary | ICD-10-CM | POA: Diagnosis not present

## 2022-03-27 MED ORDER — SOTALOL HCL 80 MG PO TABS
80.0000 mg | ORAL_TABLET | Freq: Two times a day (BID) | ORAL | 11 refills | Status: DC
  Filled 2022-03-27: qty 60, 30d supply, fill #0

## 2022-03-27 MED ORDER — APIXABAN 5 MG PO TABS
5.0000 mg | ORAL_TABLET | Freq: Two times a day (BID) | ORAL | 0 refills | Status: DC
  Filled 2022-03-27: qty 60, 30d supply, fill #0

## 2022-03-27 MED ORDER — LOSARTAN POTASSIUM 50 MG PO TABS: 50.0000 mg | ORAL_TABLET | Freq: Two times a day (BID) | ORAL | 11 refills | Status: DC

## 2022-03-27 NOTE — Progress Notes (Unsigned)
Evern Bio, NP   No chief complaint on file.   HPI:      [name] is a 37 y.o. No obstetric history on file. whose LMP was No LMP recorded., presents today for *** Hx of AUB with PCOS dx by Dr. Georgianne Barnes 1/21. Had slightly elevated testosterone on labs/neg GYN u/s 12/20 except   Fibroid 1:24.5 x 18.8 x 26.7 mm subserosal posterior left  Pt elected for IUD  Hx of HTN,  Last pap 12/20 was neg cells, POS HPV DNA. Repeat pap due  Patient Active Problem List   Diagnosis Date Noted   Cephalalgia 03/16/2014   Past Medical History:  Diagnosis Date   Anxiety    Bipolar disorder ([place])    Depression    Kidney stone      Past Surgical History:  Procedure Laterality Date   HERNIA REPAIR     LITHOTRIPSY     x 2   TUBAL LIGATION      Family History  Problem Relation Age of Onset   Alcohol abuse Mother    Drug abuse Mother    Bipolar disorder Mother    Schizophrenia Father    Alcohol abuse Father    Drug abuse Father    Kidney disease Neg Hx    Bladder Cancer Neg Hx     Social History   Socioeconomic History   Marital status: Single    Spouse name: Not on file   Number of children: Not on file   Years of education: Not on file   Highest education level: Not on file  Occupational History   Not on file  Tobacco Use   Smoking status: Never   Smokeless tobacco: Never  Substance and Sexual Activity   Alcohol use: Yes    Alcohol/week: 2.0 - 3.0 standard drinks of alcohol    Types: 2 - 3 Standard drinks or equivalent per week    Comment: wine, mixed drinks, occasional beer   Drug use: No    Comment: quit using marijuana 3 years ago   Sexual activity: Yes    Birth control/protection: None, Surgical    Comment: Tubal Ligation  Other Topics Concern   Not on file  Social History Narrative   Not on file   Social Determinants of Health   Financial Resource Strain: Not on file  Food Insecurity: Not on file  Transportation Needs: Not on file   Physical Activity: Not on file  Stress: Not on file  Social Connections: Not on file  Intimate Partner Violence: Not on file    Outpatient Medications Prior to Visit  Medication Sig Dispense Refill   ALPRAZolam (XANAX) 0.5 MG tablet TAKE 1/2 TABLET BY MOUTH EVERY 8 HOURS AS NEEDED FOR ELEVATED ANXIETY     apixaban (ELIQUIS) 5 MG TABS tablet Take 1 tablet (5 mg total) by mouth 2 (two) times daily. 60 tablet 0   Ascorbic Acid (VITAMIN C) 100 MG tablet Take 100 mg by mouth daily.     atomoxetine (STRATTERA) 40 MG capsule Take 1 capsule (40 mg total) by mouth daily. 30 capsule 0   chlorthalidone (HYGROTON) 25 MG tablet Take 1 tablet (25 mg total) by mouth daily. 30 tablet 2   citalopram (CELEXA) 20 MG tablet Take 1 tablet (20 mg total) by mouth daily. 30 tablet 0   clonazePAM (KLONOPIN) 0.5 MG tablet Take 0.5 tablet by mouth once a day 30 tablet 0   cloNIDine (CATAPRES) 0.1 MG tablet Take  1 tablet by mouth once a day for anxiety/irritability. 30 tablet 0   ergocalciferol (VITAMIN D2) 1.25 MG (50000 UT) capsule Take 1 capsule by mouth once a week. 32 capsule 0   fluticasone (FLONASE) 50 MCG/ACT nasal spray Place 1 spray into both nostrils 2 (two) times daily.     lisdexamfetamine (VYVANSE) 30 MG capsule Take 30 mg by mouth daily.     lisdexamfetamine (VYVANSE) 30 MG capsule Take 1 capsule (30 mg total) by mouth every morning. 30 capsule 0   lisdexamfetamine (VYVANSE) 30 MG capsule Take 1 capsule (30 mg total) by mouth every morning. 30 capsule 0   losartan (COZAAR) 50 MG tablet Take 1 tablet (50 mg total) by mouth daily. 90 tablet 1   losartan (COZAAR) 50 MG tablet Take 1 tablet (50 mg total) by mouth 2 (two) times daily. 60 tablet 11   Magnesium 250 MG TABS Take by mouth.     rosuvastatin (CRESTOR) 20 MG tablet Take 1 tablet (20 mg total) by mouth at bedtime for high cholesterol 150 tablet 0   sotalol (BETAPACE) 80 MG tablet Take 1 tablet (80 mg total) by mouth 2 (two) times daily. 60 tablet  11   spironolactone (ALDACTONE) 25 MG tablet Take 1 tablet (25 mg total) by mouth daily. 90 tablet 0   tamsulosin (FLOMAX) 0.4 MG CAPS capsule Take 1 capsule (0.4 mg total) by mouth daily for kidney stone movement. 5 capsule 0   traMADol (ULTRAM) 50 MG tablet Take 1 tablet (50 mg total) by mouth every 6 (six) hours as needed for moderate pain. 12 tablet 0   tretinoin (RETIN-A) 0.025 % gel Apply topically at bedtime. 45 g 0   Vitamin D, Ergocalciferol, (DRISDOL) 50000 units CAPS capsule Take 50,000 Units by mouth every 7 (seven) days.     No facility-administered medications prior to visit.      ROS:  Review of Systems BREAST: No symptoms   OBJECTIVE:   Vitals:  There were no vitals taken for this visit.  Physical Exam  Results: No results found for this or any previous visit (from the past 24 hour(s)).   Assessment/Plan: No diagnosis found.    No orders of the defined types were placed in this encounter.     No follow-ups on file.  Jayme Cham B. Royston Bekele, PA-C 03/27/2022 4:48 PM

## 2022-03-27 NOTE — Progress Notes (Signed)
Cardiology Office Note   Date:  03/27/2022   ID:  Jasmine Barnes, DOB 03/09/1985, MRN [id number]  PCP:  Evern Bio, NP  Cardiologist:  Neoma Laming, MD      History of Present Illness: Jasmine Barnes is a 37 y.o. female who presents for  Chief Complaint  Patient presents with   Follow-up    Test Results    Bp still elevated  Chest Pain  This is a recurrent problem. The current episode started yesterday. The problem occurs 2 to 4 times per day. The problem has been unchanged. The pain is present in the substernal region. Associated symptoms include dizziness and shortness of breath.  Dizziness This is a recurrent problem. The current episode started in the past 7 days. The problem has been unchanged. Associated symptoms include chest pain.  Shortness of Breath This is a chronic problem. The current episode started 1 to 4 weeks ago. The problem occurs every few minutes. Associated symptoms include chest pain.      Past Medical History:  Diagnosis Date   Anxiety    Bipolar disorder ([place])    Depression    Kidney stone      Past Surgical History:  Procedure Laterality Date   HERNIA REPAIR     LITHOTRIPSY     x 2   TUBAL LIGATION       Current Outpatient Medications  Medication Sig Dispense Refill   apixaban (ELIQUIS) 5 MG TABS tablet Take 1 tablet (5 mg total) by mouth 2 (two) times daily. 60 tablet 0   losartan (COZAAR) 50 MG tablet Take 1 tablet (50 mg total) by mouth 2 (two) times daily. 60 tablet 11   sotalol (BETAPACE) 80 MG tablet Take 1 tablet (80 mg total) by mouth 2 (two) times daily. 60 tablet 11   ALPRAZolam (XANAX) 0.5 MG tablet TAKE 1/2 TABLET BY MOUTH EVERY 8 HOURS AS NEEDED FOR ELEVATED ANXIETY     Ascorbic Acid (VITAMIN C) 100 MG tablet Take 100 mg by mouth daily.     atomoxetine (STRATTERA) 40 MG capsule Take 1 capsule (40 mg total) by mouth daily. 30 capsule 0   chlorthalidone (HYGROTON) 25 MG tablet Take 1 tablet (25 mg total) by  mouth daily. 30 tablet 2   citalopram (CELEXA) 10 MG tablet Take 1 tablet (10 mg total) by mouth daily. 30 tablet 1   citalopram (CELEXA) 20 MG tablet Take 1 tablet (20 mg total) by mouth daily. 30 tablet 0   clonazePAM (KLONOPIN) 0.5 MG tablet Take 0.5 tablet by mouth once a day 30 tablet 0   cloNIDine (CATAPRES) 0.1 MG tablet Take 1 tablet by mouth once a day for anxiety/irritability. 30 tablet 0   ergocalciferol (VITAMIN D2) 1.25 MG (50000 UT) capsule Take 1 capsule by mouth once a week. 32 capsule 0   fluconazole (DIFLUCAN) 150 MG tablet Take 1 tablet by mouth today then take 1 tablet on day 4 2 tablet 0   fluticasone (FLONASE) 50 MCG/ACT nasal spray Place 1 spray into both nostrils 2 (two) times daily.     lisdexamfetamine (VYVANSE) 30 MG capsule Take 30 mg by mouth daily.     lisdexamfetamine (VYVANSE) 30 MG capsule Take 1 capsule (30 mg total) by mouth every morning. 30 capsule 0   lisdexamfetamine (VYVANSE) 30 MG capsule Take 1 capsule (30 mg total) by mouth every morning. 30 capsule 0   Magnesium 250 MG TABS Take by mouth.  naproxen (NAPROSYN) 500 MG tablet Take 1 tablet (500 mg total) by mouth 2 (two) times daily with a meal. 20 tablet 00   nystatin cream (MYCOSTATIN) Apply to outer vaginal region every 8 hours as needed for vaginal itching 15 g 2   rosuvastatin (CRESTOR) 20 MG tablet Take 1 tablet (20 mg total) by mouth at bedtime for high cholesterol 150 tablet 0   spironolactone (ALDACTONE) 25 MG tablet Take 1 tablet (25 mg total) by mouth daily. 90 tablet 0   tamsulosin (FLOMAX) 0.4 MG CAPS capsule Take 1 capsule (0.4 mg total) by mouth daily for kidney stone movement. 5 capsule 0   tirzepatide (ZEPBOUND) 2.5 MG/0.5ML Pen Inject 2.5 mg under the skin weekly, rotating sites 2 mL 1   traMADol (ULTRAM) 50 MG tablet Take 1 tablet (50 mg total) by mouth every 6 (six) hours as needed for moderate pain. 12 tablet 0   tretinoin (RETIN-A) 0.025 % gel Apply topically at bedtime. 45 g 0    triazolam (HALCION) 0.25 MG tablet Take 1 tablet (0.25 mg total) by mouth 30-60 minutes prior to dental appointment. Must have driver bring you to and from appointment. 1 tablet 0   Vitamin D, Ergocalciferol, (DRISDOL) 50000 units CAPS capsule Take 50,000 Units by mouth every 7 (seven) days.     No current facility-administered medications for this visit.    Allergies:   Penicillin g    Social History:   reports that she has never smoked. She has never used smokeless tobacco. She reports current alcohol use of about 2.0 - 3.0 standard drinks of alcohol per week. She reports that she does not use drugs.   Family History:  family history includes Alcohol abuse in her father and mother; Bipolar disorder in her mother; Drug abuse in her father and mother; Schizophrenia in her father.    ROS:     Review of Systems  Constitutional: Negative.   HENT: Negative.    Eyes: Negative.   Respiratory:  Positive for shortness of breath.   Cardiovascular:  Positive for chest pain.  Gastrointestinal: Negative.   Genitourinary: Negative.   Musculoskeletal: Negative.   Skin: Negative.   Neurological:  Positive for dizziness.  Endo/Heme/Allergies: Negative.   Psychiatric/Behavioral: Negative.    All other systems reviewed and are negative.     All other systems are reviewed and negative.    PHYSICAL EXAM: VS:  BP (!) 158/104   Pulse 74   Ht '5\' 3"'$  (1.6 m)   Wt 207 lb (93.9 kg)   SpO2 97%   BMI 36.67 kg/m  , BMI Body mass index is 36.67 kg/m. Last weight:  Wt Readings from Last 3 Encounters:  03/27/22 207 lb (93.9 kg)  03/29/20 203 lb (92.1 kg)  01/20/19 219 lb (99.3 kg)     Physical Exam Constitutional:      Appearance: Normal appearance.  Cardiovascular:     Rate and Rhythm: Normal rate and regular rhythm.     Heart sounds: Normal heart sounds.  Pulmonary:     Effort: Pulmonary effort is normal.     Breath sounds: Normal breath sounds.  Musculoskeletal:     Right lower leg:  No edema.     Left lower leg: No edema.  Neurological:     Mental Status: She is alert.       EKG:   Recent Labs: 11/05/2021: BUN 15; Creatinine, Ser 0.83; Hemoglobin 11.7; Platelets 248; Potassium 3.4; Sodium 135    Lipid Panel No  results found for: "CHOL", "TRIG", "HDL", "CHOLHDL", "VLDL", "[facility]", "LDLDIRECT"    TESTS                                                                                          [facility] [address] [place], [place] [address] [phone] STUDY:  Gated Stress / Rest Myocardial Perfusion Imaging Tomographic (SPECT) Including attenuation correction Wall Motion, Left Ventricular Ejection Fraction By Gated Technique.Treadmill Stress Test. SEX: Female  WEIGHT: 208 lbs  HEIGHT: 63 in     ARMS UP: YES/NO                                                                        REFERRING PHYSICIAN: Dr.Quoc Tome Humphrey Rolls                                                                                                                                                                                                                       INDICATION FOR STUDY: SOB  TECHNIQUE:  Approximately 20 minutes following the intravenous administration of 10.3 mCi of Tc-93mSestamibi after stress testing in a reclined supine position with arms above their head if able to do so, gated SPECT imaging of the heart was performed. After about a 2hr break, the patient was injected intravenously with 33.6 mCi of Tc-95mestamibi.  Approximately 45 minutes later in the same position as stress imaging SPECT rest imaging of the heart was performed.  STRESS BY:  ShNeoma LamingMD PROTOCOL:  BrDarnell Level                                                                                       MAX PRED HR: 183                     85%: 156               75%: 137                                                                                                                   RESTING BP: 132/80  RESTING HR: 66  PEAK BP: 130/80   PEAK HR: 122 (66%)                                                                    EXERCISE DURATION: 7:34                                             METS:  9.4    REASON FOR TEST TERMINATION: Fatigue. SOB.  SYMPTOMS: Fatigue. SOB.  [facility] TREADMILL SCORE: 7.5                                       RISK: Low                                                                                                                                                                                                            EKG RESULTS: NSR. 66/min. No significant ST changes at peak exercise.                                                              IMAGE QUALITY: Good  PERFUSION/WALL MOTION FINDINGS: EF = 80%. Small mild fixed apex (17) wall defect, normal wall motion.                                                                          IMPRESSION: Probable normal stress test. Above defect most likely due to breast attenuation artifact with normal LVEF.                                                                                                                                                                                                                                                                                         Neoma Laming, MD Stress Interpreting Physician / Nuclear Interpreting Physician                Neoma Laming MD  Electronically signed by: Neoma Laming     Date: 02/02/2022 10:44 Other studies Reviewed: Additional studies/ records that were reviewed today include:  REASON FOR VISIT  Visit for: Echocardiogram/R06.02  Sex:   Female  wt= 207   lbs.  BP=146/90  Height= 62   inches.        TESTS  Imaging: Echocardiogram:  An echocardiogram in (2-d) mode was performed and in Doppler mode with color flow velocity mapping was performed. The aortic valve cusps are abnormal 2.0  cm, flow velocity 0000000  m/s, and systolic calculated mean flow gradient 4   mmHg. Mitral valve diastolic peak flow velocity E .706    m/s and E/A ratio 1.4. Aortic root diameter 3.4   cm. The LVOT internal diameter 2.5  cm and flow velocity was abnormal [id number]  m/s. LV systolic dimension [id number]   cm, diastolic [id number]  cm, posterior wall thickness 0.899   cm, fractional shortening 43.3 %, and EF 74.5 %. IVS thickness 0.663  cm. LA dimension 3.8 cm. Mitral Valve has Mild Regurgitation. Tricuspid Valve has  Trace Regurgitation.     ASSESSMENT  Technically adequate study.  Normal chamber sizes.  Normal left ventricular systolic function..  Normal right ventricular systolic function.  Normal right ventricular diastolic function.  Normal left ventricular wall motion.  Normal right ventricular wall motion.  Trace tricuspid regurgitation.  Normal pulmonary artery pressure.  Mild mitral regurgitation.  No pericardial effusion.  Mild LVH.     THERAPY   Referring physician: Dionisio David  Sonographer: Neoma Laming.      Neoma Laming MD  Electronically signed by: Neoma Laming     Date: 11/22/2021 11:50 Review of the above records demonstrates:       No data to display            ASSESSMENT AND PLAN:    ICD-10-CM   1. Other chest pain  R07.89 apixaban (ELIQUIS) 5 MG TABS tablet    2. SOB  (shortness of breath)  R06.02 apixaban (ELIQUIS) 5 MG TABS tablet    3. Primary hypertension  I10 apixaban (ELIQUIS) 5 MG TABS tablet    4. 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 Will add eliquis/sotolol and stop metoprolol and add losartan.       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 Will add eliquis/sotolol and stop metoprolol and add losartan.   Relevant Medications   apixaban (ELIQUIS) 5 MG TABS tablet          Disposition:   No follow-ups on file.    Total time spent: 45 minutes  Signed,  Neoma Laming, MD  03/27/2022 10:25 [place]

## 2022-03-28 ENCOUNTER — Encounter: Payer: Self-pay | Admitting: Obstetrics and Gynecology

## 2022-03-28 ENCOUNTER — Ambulatory Visit (INDEPENDENT_AMBULATORY_CARE_PROVIDER_SITE_OTHER): Payer: 59 | Admitting: Obstetrics and Gynecology

## 2022-03-28 VITALS — BP 148/90 | Ht 62.5 in | Wt 213.0 lb

## 2022-03-28 DIAGNOSIS — D500 Iron deficiency anemia secondary to blood loss (chronic): Secondary | ICD-10-CM | POA: Diagnosis not present

## 2022-03-28 DIAGNOSIS — D259 Leiomyoma of uterus, unspecified: Secondary | ICD-10-CM

## 2022-03-28 DIAGNOSIS — Z113 Encounter for screening for infections with a predominantly sexual mode of transmission: Secondary | ICD-10-CM | POA: Diagnosis not present

## 2022-03-28 DIAGNOSIS — N920 Excessive and frequent menstruation with regular cycle: Secondary | ICD-10-CM

## 2022-03-28 DIAGNOSIS — D219 Benign neoplasm of connective and other soft tissue, unspecified: Secondary | ICD-10-CM | POA: Insufficient documentation

## 2022-03-28 DIAGNOSIS — R87810 Cervical high risk human papillomavirus (HPV) DNA test positive: Secondary | ICD-10-CM | POA: Insufficient documentation

## 2022-03-28 DIAGNOSIS — Z30014 Encounter for initial prescription of intrauterine contraceptive device: Secondary | ICD-10-CM

## 2022-03-28 NOTE — Patient Instructions (Signed)
I value your feedback and you entrusting us with your care. If you get a [facility] patient survey, I would appreciate you taking the time to let us know about your experience today. Thank you! ? ? ?

## 2022-03-29 LAB — CBC WITH DIFFERENTIAL/PLATELET
Basophils Absolute: 0.1 10*3/uL (ref 0.0–0.2)
Basos: 1 %
EOS (ABSOLUTE): 0.2 10*3/uL (ref 0.0–0.4)
Eos: 3 %
Hematocrit: 37.9 % (ref 34.0–46.6)
Hemoglobin: 12.5 g/dL (ref 11.1–15.9)
Immature Grans (Abs): 0 10*3/uL (ref 0.0–0.1)
Immature Granulocytes: 0 %
Lymphocytes Absolute: 1.9 10*3/uL (ref 0.7–3.1)
Lymphs: 32 %
MCH: 30 pg (ref 26.6–33.0)
MCHC: 33 g/dL (ref 31.5–35.7)
MCV: 91 fL (ref 79–97)
Monocytes Absolute: 0.4 10*3/uL (ref 0.1–0.9)
Monocytes: 7 %
Neutrophils Absolute: 3.4 10*3/uL (ref 1.4–7.0)
Neutrophils: 57 %
Platelets: 207 10*3/uL (ref 150–450)
RBC: 4.17 x10E6/uL (ref 3.77–5.28)
RDW: 14.2 % (ref 11.7–15.4)
WBC: 6 10*3/uL (ref 3.4–10.8)

## 2022-03-29 LAB — IRON,TIBC AND FERRITIN PANEL
Ferritin: 16 ng/mL (ref 15–150)
Iron Saturation: 13 % — ABNORMAL LOW (ref 15–55)
Iron: 53 ug/dL (ref 27–159)
Total Iron Binding Capacity: 412 ug/dL (ref 250–450)
UIBC: 359 ug/dL (ref 131–425)

## 2022-03-29 LAB — HEPATITIS C ANTIBODY: Hep C Virus Ab: NONREACTIVE

## 2022-03-29 LAB — HIV ANTIBODY (ROUTINE TESTING W REFLEX): HIV Screen 4th Generation wRfx: NONREACTIVE

## 2022-03-29 LAB — RPR: RPR Ser Ql: NONREACTIVE

## 2022-03-31 ENCOUNTER — Encounter: Payer: Self-pay | Admitting: Cardiovascular Disease

## 2022-04-04 ENCOUNTER — Ambulatory Visit (INDEPENDENT_AMBULATORY_CARE_PROVIDER_SITE_OTHER): Payer: 59 | Admitting: Obstetrics and Gynecology

## 2022-04-04 ENCOUNTER — Other Ambulatory Visit (HOSPITAL_COMMUNITY)
Admission: RE | Admit: 2022-04-04 | Discharge: 2022-04-04 | Disposition: A | Discharge status: Home / Self Care | Payer: 59 | Source: Ambulatory Visit | Attending: Obstetrics and Gynecology | Admitting: Obstetrics and Gynecology

## 2022-04-04 ENCOUNTER — Encounter: Payer: Self-pay | Admitting: Obstetrics and Gynecology

## 2022-04-04 VITALS — BP 120/80 | Ht 62.5 in | Wt 211.0 lb

## 2022-04-04 DIAGNOSIS — Z113 Encounter for screening for infections with a predominantly sexual mode of transmission: Secondary | ICD-10-CM | POA: Insufficient documentation

## 2022-04-04 DIAGNOSIS — N920 Excessive and frequent menstruation with regular cycle: Secondary | ICD-10-CM

## 2022-04-04 DIAGNOSIS — Z30430 Encounter for insertion of intrauterine contraceptive device: Secondary | ICD-10-CM | POA: Diagnosis not present

## 2022-04-04 DIAGNOSIS — D219 Benign neoplasm of connective and other soft tissue, unspecified: Secondary | ICD-10-CM

## 2022-04-04 MED ORDER — LEVONORGESTREL 20 MCG/DAY IU IUD
1.0000 | INTRAUTERINE_SYSTEM | Freq: Once | INTRAUTERINE | Status: AC
  Administered 2022-04-04: 1 via INTRAUTERINE

## 2022-04-04 NOTE — Patient Instructions (Addendum)
I value your feedback and you entrusting us with your care. If you get a [facility] patient survey, I would appreciate you taking the time to let us know about your experience today. Thank you!  Instructions after IUD insertion  Most women experience no significant problems after insertion of an IUD, however minor cramping and spotting for a few days is common. Cramps may be treated with ibuprofen 800mg every 8 hours or Tylenol 650 mg every 4 hours. Contact [facility] immediately if you experience any of the following symptoms during the next week: temperature >99.6 degrees, worsening pelvic pain, abdominal pain, fainting, unusually heavy vaginal bleeding, foul vaginal discharge, or if you think you have expelled the IUD. Nothing inserted in the vagina for 48 hours. You will be scheduled for a follow up visit in approximately four weeks.   

## 2022-04-04 NOTE — Progress Notes (Signed)
   Chief Complaint  Patient presents with   Contraception    Mirena insertion, std testing      IUD PROCEDURE NOTE:  Jasmine Barnes is a 37 y.o. [phone] here for Mirena  IUD insertion for menorrhagia/IDA. Would also like STD testing to be safe, no known exposures/sx.  Hx of AUB with PCOS dx by Dr. Georgianne Fick 1/21. Had slightly elevated testosterone on labs/neg GYN u/s 12/20 except fibroid 24.5 x 18.8 x 26.7 mm subserosal posterior left.   BP 120/80   Ht 5' 2.5" (1.588 m)   Wt 211 lb (95.7 kg)   LMP 03/27/2022 (Exact Date)   BMI 37.98 kg/m   IUD Insertion Procedure Note Patient identified, informed consent performed, consent signed.   Discussed risks of irregular bleeding, cramping, infection, malpositioning or misplacement of the IUD outside the uterus which may require further procedure such as laparoscopy, risk of failure <1%. Time out was performed.    Speculum placed in the vagina.  Cervix visualized.  Cleaned with Betadine x 2.  Grasped anteriorly with a single tooth tenaculum.  Uterus sounded to 8.0 cm.   IUD placed per manufacturer's recommendations.  Strings trimmed to 3 cm. Tenaculum was removed, good hemostasis noted.  Patient tolerated procedure well.   ASSESSMENT:  Encounter for insertion of intrauterine contraceptive device (IUD)  Menorrhagia with regular cycle--try IUD. F/u prn. Start Fe supp but pt to confirm with MD because can affect new BP med.  Leiomyoma  Screening for STD (sexually transmitted disease) - Plan: Cervicovaginal ancillary only   Meds ordered this encounter  Medications   levonorgestrel (MIRENA) 20 MCG/DAY IUD 1 each     Plan:  Patient was given post-procedure instructions.  She was advised to have backup contraception for one week.   Call if you are having increasing pain, cramps or bleeding or if you have a fever greater than 100.4 degrees F., shaking chills, nausea or vomiting. Patient was also asked to check IUD strings periodically  and follow up in 4 weeks for IUD check.  Return in about 4 weeks (around 05/02/2022) for IUD f/u.  Lerin Jech B. Jolan Upchurch, PA-C 04/04/2022 12:28 PM

## 2022-04-05 ENCOUNTER — Encounter: Payer: Self-pay | Admitting: Obstetrics and Gynecology

## 2022-04-05 ENCOUNTER — Ambulatory Visit: Payer: 59 | Admitting: Nurse Practitioner

## 2022-04-05 LAB — CERVICOVAGINAL ANCILLARY ONLY
Chlamydia: NEGATIVE
Comment: NEGATIVE
Comment: NORMAL
Neisseria Gonorrhea: NEGATIVE

## 2022-04-10 ENCOUNTER — Encounter: Payer: Self-pay | Admitting: Cardiovascular Disease

## 2022-04-10 ENCOUNTER — Other Ambulatory Visit (HOSPITAL_COMMUNITY): Payer: Self-pay

## 2022-04-10 ENCOUNTER — Ambulatory Visit (INDEPENDENT_AMBULATORY_CARE_PROVIDER_SITE_OTHER): Payer: 59 | Admitting: Cardiovascular Disease

## 2022-04-10 VITALS — BP 120/80 | HR 78 | Ht 62.5 in | Wt 212.0 lb

## 2022-04-10 DIAGNOSIS — I10 Essential (primary) hypertension: Secondary | ICD-10-CM | POA: Diagnosis not present

## 2022-04-10 DIAGNOSIS — Z8679 Personal history of other diseases of the circulatory system: Secondary | ICD-10-CM | POA: Insufficient documentation

## 2022-04-10 MED ORDER — SOTALOL HCL 80 MG PO TABS
ORAL_TABLET | ORAL | 11 refills | Status: DC
  Filled 2022-04-10 – 2022-04-21 (×2): qty 80, 26d supply, fill #0

## 2022-04-10 NOTE — Progress Notes (Signed)
Cardiology Office Note   Date:  04/10/2022   ID:  Jasmine Barnes, DOB [birth year], MRN [id number]  PCP:  Evern Bio, NP  Cardiologist:  Neoma Laming, MD      History of Present Illness: Jasmine Barnes is a 37 y.o. female who presents for  Chief Complaint  Patient presents with   Follow-up    2 week follow up    Patient in office for 2 week follow up. Denies chest pain, shortness of breath. Has had one episode of fast heart rate since previous appointment. Reports she did not start the Eliquis.     Past Medical History:  Diagnosis Date   Anxiety    Atrial fibrillation, currently in sinus rhythm    Bipolar disorder ([place])    Depression    Kidney stone    Leiomyoma 2021   suserosal on u/s   Mixed hyperlipidemia    PCOS (polycystic ovarian syndrome) 2021   Dr. Georgianne Fick   Pre-diabetes    Primary hypertension      Past Surgical History:  Procedure Laterality Date   HERNIA REPAIR     LITHOTRIPSY     x 2   TUBAL LIGATION       Current Outpatient Medications  Medication Sig Dispense Refill   ALPRAZolam (XANAX) 0.5 MG tablet TAKE 1/2 TABLET BY MOUTH EVERY 8 HOURS AS NEEDED FOR ELEVATED ANXIETY     Ascorbic Acid (VITAMIN C) 100 MG tablet Take 100 mg by mouth daily.     clonazePAM (KLONOPIN) 0.5 MG tablet Take 0.5 tablet by mouth once a day 30 tablet 0   cloNIDine (CATAPRES) 0.1 MG tablet Take 1 tablet by mouth once a day for anxiety/irritability. 30 tablet 0   Cyanocobalamin (VITAMIN B-12 PO) Take by mouth.     levonorgestrel (MIRENA, 52 MG,) 20 MCG/DAY IUD 1 each by Intrauterine route once.     losartan (COZAAR) 50 MG tablet Take 1 tablet (50 mg total) by mouth 2 (two) times daily. 60 tablet 11   Magnesium 250 MG TABS Take by mouth.     Magnesium Gluconate 550 MG TABS Take by mouth.     rosuvastatin (CRESTOR) 10 MG tablet TAKE ONE TABLET BY MOUTH ONCE NIGHTLY AT BEDTIME     Vitamin D, Ergocalciferol, (DRISDOL) 50000 units CAPS capsule Take 50,000 Units by  mouth every 7 (seven) days.     sotalol (BETAPACE) 80 MG tablet Take one tablet by mouth twice daily, take an extra tablet as needed for elevated heart rate 80 tablet 11   No current facility-administered medications for this visit.    Allergies:   Penicillin g and Penicillins    Social History:   reports that she has never smoked. She has never used smokeless tobacco. She reports current alcohol use of about 2.0 - 3.0 standard drinks of alcohol per week. She reports that she does not use drugs.   Family History:  family history includes Alcohol abuse in her father and mother; Bipolar disorder in her mother; Drug abuse in her father and mother; Schizophrenia in her father.    ROS:     Review of Systems  Constitutional: Negative.   HENT: Negative.    Eyes: Negative.   Respiratory: Negative.    Cardiovascular: Negative.   Gastrointestinal: Negative.   Genitourinary: Negative.   Musculoskeletal: Negative.   Skin: Negative.   Neurological: Negative.   Endo/Heme/Allergies: Negative.   Psychiatric/Behavioral: Negative.    All other systems reviewed and are  negative.   All other systems are reviewed and negative.   PHYSICAL EXAM: VS:  BP 120/80   Pulse 78   Ht 5' 2.5" (1.588 m)   Wt 212 lb (96.2 kg)   LMP 03/27/2022 (Exact Date)   SpO2 99%   BMI 38.16 kg/m  , BMI Body mass index is 38.16 kg/m. Last weight:  Wt Readings from Last 3 Encounters:  04/10/22 212 lb (96.2 kg)  04/04/22 211 lb (95.7 kg)  03/28/22 213 lb (96.6 kg)    Physical Exam Constitutional:      Appearance: Normal appearance.  Cardiovascular:     Rate and Rhythm: Normal rate and regular rhythm.     Heart sounds: Normal heart sounds.  Pulmonary:     Effort: Pulmonary effort is normal.     Breath sounds: Normal breath sounds.  Musculoskeletal:     Right lower leg: No edema.     Left lower leg: No edema.  Neurological:     Mental Status: She is alert.     EKG: none today  Recent  Labs: 11/05/2021: BUN 15; Creatinine, Ser 0.83; Potassium 3.4; Sodium 135 03/28/2022: Hemoglobin 12.5; Platelets 207    Lipid Panel No results found for: "CHOL", "TRIG", "HDL", "CHOLHDL", "VLDL", "LDLCALC", "LDLDIRECT"    ASSESSMENT AND PLAN:    ICD-10-CM   1. Atrial fibrillation, currently in sinus rhythm  Z86.79 sotalol (BETAPACE) 80 MG tablet    2. Primary hypertension  I10        Problem List Items Addressed This Visit       Cardiovascular and Mediastinum   Primary hypertension    Patient taking and tolerating losartan. B/p well controlled.       Relevant Medications   sotalol (BETAPACE) 80 MG tablet     Other   Atrial fibrillation, currently in sinus rhythm - Primary    In sinus rhythm on auscultation. Continue sotalol as prescribed. May take an extra sotalol as needed for continuous elevated heart rate.       Relevant Medications   sotalol (BETAPACE) 80 MG tablet     Disposition:   Return in about 2 months (around 06/10/2022).    Total time spent: 30 minutes  Signed,  Neoma Laming, MD  04/10/2022 10:15 AM    [place]

## 2022-04-10 NOTE — Assessment & Plan Note (Signed)
In sinus rhythm on auscultation. Continue sotalol as prescribed. May take an extra sotalol as needed for continuous elevated heart rate.

## 2022-04-10 NOTE — Assessment & Plan Note (Signed)
Patient taking and tolerating losartan. B/p well controlled.

## 2022-04-16 ENCOUNTER — Other Ambulatory Visit (HOSPITAL_COMMUNITY): Payer: Self-pay

## 2022-04-17 ENCOUNTER — Other Ambulatory Visit (HOSPITAL_COMMUNITY): Payer: Self-pay

## 2022-04-21 ENCOUNTER — Other Ambulatory Visit (HOSPITAL_COMMUNITY): Payer: Self-pay

## 2022-04-21 ENCOUNTER — Other Ambulatory Visit: Payer: Self-pay | Admitting: Nurse Practitioner

## 2022-04-21 MED FILL — Ergocalciferol Cap 1.25 MG (50000 Unit): ORAL | 84 days supply | Qty: 12 | Fill #0 | Status: AC

## 2022-04-26 ENCOUNTER — Telehealth: Payer: Self-pay

## 2022-04-26 NOTE — Telephone Encounter (Signed)
Called pt to check on tomorrow's appt, is she having issues that she wants IUD checked or was she  misscheduled. Per [facility], she needs an IUD check 4-6 weeks from insertion (04/04/22). No answer, LVMTRC.

## 2022-04-26 NOTE — Progress Notes (Deleted)
   No chief complaint on file.    History of Present Illness:  Jasmine Barnes is a 37 y.o. that had a {IUD:23561} IUD placed approximately {NUMBERS:20191} {Weeks/Months:20313} ago. Since that time, she denies dyspareunia, pelvic pain, non-menstrual bleeding, vaginal d/c, heavy bleeding.   Review of Systems  Physical Exam:  LMP 03/27/2022 (Exact Date)  There is no height or weight on file to calculate BMI.  Pelvic exam:  Two IUD strings {DESC; PRESENT/ABSENT:17923} seen coming from the cervical os. EGBUS, vaginal vault and cervix: within normal limits   Assessment:   No diagnosis found.  IUD strings present in proper location; pt doing well  Plan: F/u if any signs of infection or can no longer feel the strings.   Marchell Froman B. Lenna Hagarty, PA-C 04/26/2022 12:13 PM

## 2022-04-26 NOTE — Telephone Encounter (Signed)
Called pt again, she is not having issues, she was misscheduled. April aware to cancel appt for tomorrow, pt will call back when she has her calendar in front of her.

## 2022-04-27 ENCOUNTER — Other Ambulatory Visit (HOSPITAL_COMMUNITY): Payer: Self-pay

## 2022-04-27 ENCOUNTER — Ambulatory Visit: Payer: 59 | Admitting: Obstetrics and Gynecology

## 2022-04-27 DIAGNOSIS — F411 Generalized anxiety disorder: Secondary | ICD-10-CM | POA: Diagnosis not present

## 2022-04-27 DIAGNOSIS — F900 Attention-deficit hyperactivity disorder, predominantly inattentive type: Secondary | ICD-10-CM | POA: Diagnosis not present

## 2022-04-27 MED ORDER — METHYLPHENIDATE HCL ER (OSM) 18 MG PO TBCR
18.0000 mg | EXTENDED_RELEASE_TABLET | Freq: Every morning | ORAL | 0 refills | Status: DC
  Filled 2022-04-27: qty 10, 10d supply, fill #0

## 2022-04-28 ENCOUNTER — Other Ambulatory Visit (HOSPITAL_COMMUNITY): Payer: Self-pay

## 2022-05-01 ENCOUNTER — Other Ambulatory Visit (HOSPITAL_COMMUNITY): Payer: Self-pay

## 2022-05-01 ENCOUNTER — Encounter: Payer: Self-pay | Admitting: Cardiovascular Disease

## 2022-05-01 MED ORDER — LISDEXAMFETAMINE DIMESYLATE 20 MG PO CAPS
20.0000 mg | ORAL_CAPSULE | Freq: Every morning | ORAL | 0 refills | Status: DC
  Filled 2022-05-01: qty 15, 15d supply, fill #0

## 2022-05-02 ENCOUNTER — Other Ambulatory Visit (HOSPITAL_COMMUNITY): Payer: Self-pay

## 2022-05-02 ENCOUNTER — Other Ambulatory Visit: Payer: Self-pay | Admitting: Cardiovascular Disease

## 2022-05-02 DIAGNOSIS — Z8679 Personal history of other diseases of the circulatory system: Secondary | ICD-10-CM

## 2022-05-02 DIAGNOSIS — I10 Essential (primary) hypertension: Secondary | ICD-10-CM

## 2022-05-02 MED ORDER — SOTALOL HCL 80 MG PO TABS
ORAL_TABLET | ORAL | 11 refills | Status: DC
  Filled 2022-05-02: qty 80, fill #0

## 2022-05-02 MED ORDER — LOSARTAN POTASSIUM 50 MG PO TABS
50.0000 mg | ORAL_TABLET | Freq: Two times a day (BID) | ORAL | 11 refills | Status: DC
  Filled 2022-05-02 – 2022-06-01 (×4): qty 60, 30d supply, fill #0

## 2022-05-05 ENCOUNTER — Other Ambulatory Visit (HOSPITAL_COMMUNITY): Payer: Self-pay

## 2022-05-07 ENCOUNTER — Encounter: Payer: Self-pay | Admitting: Cardiovascular Disease

## 2022-05-09 ENCOUNTER — Ambulatory Visit: Payer: 59 | Admitting: Cardiovascular Disease

## 2022-05-09 ENCOUNTER — Encounter: Payer: Self-pay | Admitting: Cardiovascular Disease

## 2022-05-11 ENCOUNTER — Other Ambulatory Visit (HOSPITAL_COMMUNITY): Payer: Self-pay

## 2022-05-11 ENCOUNTER — Ambulatory Visit (INDEPENDENT_AMBULATORY_CARE_PROVIDER_SITE_OTHER): Payer: 59 | Admitting: Cardiovascular Disease

## 2022-05-11 ENCOUNTER — Encounter (HOSPITAL_COMMUNITY): Payer: Self-pay

## 2022-05-11 ENCOUNTER — Encounter: Payer: Self-pay | Admitting: Cardiovascular Disease

## 2022-05-11 VITALS — BP 132/82 | HR 72 | Ht 63.0 in | Wt 216.6 lb

## 2022-05-11 DIAGNOSIS — Z8679 Personal history of other diseases of the circulatory system: Secondary | ICD-10-CM | POA: Diagnosis not present

## 2022-05-11 DIAGNOSIS — I10 Essential (primary) hypertension: Secondary | ICD-10-CM | POA: Diagnosis not present

## 2022-05-11 MED ORDER — SOTALOL HCL 160 MG PO TABS
160.0000 mg | ORAL_TABLET | Freq: Two times a day (BID) | ORAL | 2 refills | Status: DC
  Filled 2022-05-11: qty 60, 20d supply, fill #0
  Filled 2022-07-06: qty 60, 20d supply, fill #1
  Filled 2022-08-14: qty 60, 20d supply, fill #2

## 2022-05-11 NOTE — Progress Notes (Signed)
Cardiology Office Note   Date:  05/11/2022   ID:  Jasmine, Barnes 1985/05/28, MRN [id number]  PCP:  Orson Eva, NP  Cardiologist:  Adrian Blackwater, MD      History of Present Illness: Jasmine Barnes is a 37 y.o. female who presents for  Chief Complaint  Patient presents with   Follow-up    Follow up, Afib concerns.    Patient in office to discuss options related to atrial fibrillation. Complains of chest pain, shortness of breath, palpitations with minimal exercise.     Past Medical History:  Diagnosis Date   Anxiety    Atrial fibrillation, currently in sinus rhythm    Bipolar disorder    Depression    Kidney stone    Leiomyoma 2021   suserosal on u/s   Mixed hyperlipidemia    PCOS (polycystic ovarian syndrome) 2021   Dr. Bonney Aid   Pre-diabetes    Primary hypertension      Past Surgical History:  Procedure Laterality Date   HERNIA REPAIR     LITHOTRIPSY     x 2   TUBAL LIGATION       Current Outpatient Medications  Medication Sig Dispense Refill   Ascorbic Acid (VITAMIN C) 100 MG tablet Take 100 mg by mouth daily.     cloNIDine (CATAPRES) 0.1 MG tablet Take 1 tablet by mouth once a day for anxiety/irritability. 30 tablet 0   Cyanocobalamin (VITAMIN B-12 PO) Take by mouth.     levonorgestrel (MIRENA, 52 MG,) 20 MCG/DAY IUD 1 each by Intrauterine route once.     lisdexamfetamine (VYVANSE) 20 MG capsule Take 1 capsule (20 mg total) by mouth every morning. 15 capsule 0   losartan (COZAAR) 50 MG tablet Take 1 tablet (50 mg total) by mouth 2 (two) times daily. 60 tablet 11   Magnesium 250 MG TABS Take by mouth.     Magnesium Gluconate 550 MG TABS Take by mouth.     methylphenidate 18 MG PO CR tablet Take 1 tablet (18 mg total) by mouth in the morning. 10 tablet 0   rosuvastatin (CRESTOR) 10 MG tablet TAKE ONE TABLET BY MOUTH ONCE NIGHTLY AT BEDTIME     Vitamin D, Ergocalciferol, (DRISDOL) 1.25 MG (50000 UNIT) CAPS capsule Take 1 capsule by mouth  once a week. 32 capsule 0   sotalol (BETAPACE) 160 MG tablet Take one tablet by mouth twice daily, take an extra tablet as needed for elevated heart rate 60 tablet 2   No current facility-administered medications for this visit.    Allergies:   Penicillin g and Penicillins    Social History:   reports that she has never smoked. She has never used smokeless tobacco. She reports current alcohol use of about 2.0 - 3.0 standard drinks of alcohol per week. She reports that she does not use drugs.   Family History:  family history includes Alcohol abuse in her father and mother; Bipolar disorder in her mother; Drug abuse in her father and mother; Schizophrenia in her father.    ROS:     Review of Systems  Constitutional: Negative.   HENT: Negative.    Eyes: Negative.   Respiratory:  Positive for shortness of breath.   Cardiovascular:  Positive for chest pain and palpitations.  Gastrointestinal: Negative.   Genitourinary: Negative.   Musculoskeletal: Negative.   Skin: Negative.   Neurological: Negative.   Endo/Heme/Allergies: Negative.   Psychiatric/Behavioral: Negative.    All other systems reviewed and  are negative.   All other systems are reviewed and negative.   PHYSICAL EXAM: VS:  BP 132/82   Pulse 72   Ht  (1.6 m)   Wt 216 lb 9.6 oz (98.2 kg)   SpO2 99%   BMI 38.37 kg/m  , BMI Body mass index is 38.37 kg/m. Last weight:  Wt Readings from Last 3 Encounters:  05/11/22 216 lb 9.6 oz (98.2 kg)  04/10/22 212 lb (96.2 kg)  04/04/22 211 lb (95.7 kg)    Physical Exam Constitutional:      Appearance: Normal appearance.  Cardiovascular:     Rate and Rhythm: Normal rate and regular rhythm.     Heart sounds: Normal heart sounds.  Pulmonary:     Effort: Pulmonary effort is normal.     Breath sounds: Normal breath sounds.  Musculoskeletal:     Right lower leg: No edema.     Left lower leg: No edema.  Neurological:     Mental Status: She is alert.     EKG: none  today  Recent Labs: 11/05/2021: BUN 15; Creatinine, Ser 0.83; Potassium 3.4; Sodium 135 03/28/2022: Hemoglobin 12.5; Platelets 207    Lipid Panel No results found for: "CHOL", "TRIG", "HDL", "CHOLHDL", "VLDL", "LDLCALC", "LDLDIRECT"    Other studies Reviewed: none today  ASSESSMENT AND PLAN:    ICD-10-CM   1. Primary hypertension  I10     2. Atrial fibrillation, currently in sinus rhythm  Z86.79 sotalol (BETAPACE) 160 MG tablet       Problem List Items Addressed This Visit       Cardiovascular and Mediastinum   Primary hypertension - Primary    Controlled today.       Relevant Medications   sotalol (BETAPACE) 160 MG tablet     Other   Atrial fibrillation, currently in sinus rhythm    In sinus rhythm, today on auscultation. Will increase sotalol to 160 mg twice daily.       Relevant Medications   sotalol (BETAPACE) 160 MG tablet     Disposition:   No follow-ups on file.    Total time spent: 30 minutes  Signed,  Adrian Blackwater, MD  05/11/2022 10:25 AM    [facility]

## 2022-05-11 NOTE — Assessment & Plan Note (Signed)
Controlled today 

## 2022-05-11 NOTE — Assessment & Plan Note (Signed)
In sinus rhythm, today on auscultation. Will increase sotalol to 160 mg twice daily.

## 2022-05-12 ENCOUNTER — Other Ambulatory Visit (HOSPITAL_COMMUNITY): Payer: Self-pay

## 2022-05-15 ENCOUNTER — Other Ambulatory Visit (HOSPITAL_COMMUNITY): Payer: Self-pay

## 2022-05-15 DIAGNOSIS — F4312 Post-traumatic stress disorder, chronic: Secondary | ICD-10-CM | POA: Diagnosis not present

## 2022-05-15 DIAGNOSIS — F411 Generalized anxiety disorder: Secondary | ICD-10-CM | POA: Diagnosis not present

## 2022-05-15 DIAGNOSIS — F900 Attention-deficit hyperactivity disorder, predominantly inattentive type: Secondary | ICD-10-CM | POA: Diagnosis not present

## 2022-05-15 MED ORDER — LISDEXAMFETAMINE DIMESYLATE 20 MG PO CAPS
20.0000 mg | ORAL_CAPSULE | Freq: Every morning | ORAL | 0 refills | Status: DC
  Filled 2022-05-15: qty 30, 30d supply, fill #0

## 2022-05-16 ENCOUNTER — Other Ambulatory Visit (HOSPITAL_COMMUNITY): Payer: Self-pay

## 2022-05-18 ENCOUNTER — Other Ambulatory Visit (HOSPITAL_COMMUNITY): Payer: Self-pay

## 2022-05-19 ENCOUNTER — Other Ambulatory Visit (HOSPITAL_COMMUNITY): Payer: Self-pay

## 2022-05-24 ENCOUNTER — Other Ambulatory Visit (HOSPITAL_COMMUNITY): Payer: Self-pay

## 2022-05-25 ENCOUNTER — Other Ambulatory Visit (HOSPITAL_COMMUNITY): Payer: Self-pay

## 2022-05-29 ENCOUNTER — Encounter: Payer: Self-pay | Admitting: Obstetrics and Gynecology

## 2022-05-30 ENCOUNTER — Other Ambulatory Visit: Payer: Self-pay

## 2022-05-31 ENCOUNTER — Encounter: Payer: Self-pay | Admitting: Cardiovascular Disease

## 2022-06-01 ENCOUNTER — Other Ambulatory Visit (HOSPITAL_COMMUNITY): Payer: Self-pay

## 2022-06-05 ENCOUNTER — Other Ambulatory Visit (HOSPITAL_COMMUNITY): Payer: Self-pay

## 2022-06-05 ENCOUNTER — Other Ambulatory Visit: Payer: Self-pay | Admitting: Cardiovascular Disease

## 2022-06-05 DIAGNOSIS — I10 Essential (primary) hypertension: Secondary | ICD-10-CM

## 2022-06-05 MED ORDER — LOSARTAN POTASSIUM 50 MG PO TABS
50.0000 mg | ORAL_TABLET | Freq: Two times a day (BID) | ORAL | 1 refills | Status: DC
Start: 2022-06-05 — End: 2022-09-08
  Filled 2022-06-05: qty 180, 90d supply, fill #0
  Filled 2022-06-07: qty 180, 90d supply, fill #1

## 2022-06-07 ENCOUNTER — Other Ambulatory Visit (HOSPITAL_COMMUNITY): Payer: Self-pay

## 2022-06-28 ENCOUNTER — Other Ambulatory Visit (HOSPITAL_COMMUNITY): Payer: Self-pay

## 2022-06-28 DIAGNOSIS — F900 Attention-deficit hyperactivity disorder, predominantly inattentive type: Secondary | ICD-10-CM | POA: Diagnosis not present

## 2022-06-28 DIAGNOSIS — F411 Generalized anxiety disorder: Secondary | ICD-10-CM | POA: Diagnosis not present

## 2022-06-28 DIAGNOSIS — F4312 Post-traumatic stress disorder, chronic: Secondary | ICD-10-CM | POA: Diagnosis not present

## 2022-06-28 MED ORDER — LISDEXAMFETAMINE DIMESYLATE 20 MG PO CAPS: 20.0000 mg | ORAL_CAPSULE | Freq: Every morning | ORAL | 0 refills | Status: DC

## 2022-06-28 MED ORDER — CLONIDINE HCL 0.1 MG PO TABS
0.1000 mg | ORAL_TABLET | Freq: Every day | ORAL | 1 refills | Status: DC
  Filled 2022-06-28 – 2022-07-11 (×2): qty 30, 30d supply, fill #0

## 2022-06-28 MED ORDER — LISDEXAMFETAMINE DIMESYLATE 20 MG PO CAPS
20.0000 mg | ORAL_CAPSULE | Freq: Every morning | ORAL | 0 refills | Status: DC
  Filled 2022-07-06: qty 30, 30d supply, fill #0

## 2022-06-29 ENCOUNTER — Other Ambulatory Visit (HOSPITAL_COMMUNITY): Payer: Self-pay

## 2022-07-06 ENCOUNTER — Other Ambulatory Visit: Payer: Self-pay

## 2022-07-06 ENCOUNTER — Other Ambulatory Visit (HOSPITAL_COMMUNITY): Payer: Self-pay

## 2022-07-10 ENCOUNTER — Encounter (HOSPITAL_COMMUNITY): Payer: Self-pay

## 2022-07-10 ENCOUNTER — Emergency Department (HOSPITAL_COMMUNITY): Payer: 59

## 2022-07-10 ENCOUNTER — Observation Stay (HOSPITAL_COMMUNITY)
Admission: EM | Admit: 2022-07-10 | Discharge: 2022-07-11 | Discharge status: Against Medical Advice | Payer: 59 | Attending: Internal Medicine | Admitting: Internal Medicine

## 2022-07-10 ENCOUNTER — Other Ambulatory Visit (HOSPITAL_COMMUNITY): Payer: Self-pay

## 2022-07-10 DIAGNOSIS — I160 Hypertensive urgency: Secondary | ICD-10-CM | POA: Diagnosis not present

## 2022-07-10 DIAGNOSIS — I161 Hypertensive emergency: Secondary | ICD-10-CM | POA: Diagnosis not present

## 2022-07-10 DIAGNOSIS — G988 Other disorders of nervous system: Secondary | ICD-10-CM | POA: Diagnosis not present

## 2022-07-10 DIAGNOSIS — R29818 Other symptoms and signs involving the nervous system: Secondary | ICD-10-CM | POA: Diagnosis not present

## 2022-07-10 DIAGNOSIS — E785 Hyperlipidemia, unspecified: Secondary | ICD-10-CM

## 2022-07-10 DIAGNOSIS — Z79899 Other long term (current) drug therapy: Secondary | ICD-10-CM | POA: Diagnosis not present

## 2022-07-10 DIAGNOSIS — I480 Paroxysmal atrial fibrillation: Secondary | ICD-10-CM | POA: Insufficient documentation

## 2022-07-10 DIAGNOSIS — Z0389 Encounter for observation for other suspected diseases and conditions ruled out: Secondary | ICD-10-CM | POA: Diagnosis not present

## 2022-07-10 DIAGNOSIS — H532 Diplopia: Secondary | ICD-10-CM | POA: Diagnosis not present

## 2022-07-10 DIAGNOSIS — I10 Essential (primary) hypertension: Secondary | ICD-10-CM | POA: Insufficient documentation

## 2022-07-10 LAB — COMPREHENSIVE METABOLIC PANEL
ALT: 22 U/L (ref 0–44)
AST: 24 U/L (ref 15–41)
Albumin: 4.1 g/dL (ref 3.5–5.0)
Alkaline Phosphatase: 66 U/L (ref 38–126)
Anion gap: 9 (ref 5–15)
BUN: 12 mg/dL (ref 6–20)
CO2: 22 mmol/L (ref 22–32)
Calcium: 9 mg/dL (ref 8.9–10.3)
Chloride: 102 mmol/L (ref 98–111)
Creatinine, Ser: 0.74 mg/dL (ref 0.44–1.00)
GFR, Estimated: >60 mL/min (ref >60)
Glucose, Bld: 94 mg/dL (ref 70–99)
Potassium: 3.6 mmol/L (ref 3.5–5.1)
Sodium: 133 mmol/L — ABNORMAL LOW (ref 135–145)
Total Bilirubin: 0.5 mg/dL (ref 0.3–1.2)
Total Protein: 7.7 g/dL (ref 6.5–8.1)

## 2022-07-10 LAB — DIFFERENTIAL
Abs Immature Granulocytes: 0.01 10*3/uL (ref 0.00–0.07)
Basophils Absolute: 0.1 10*3/uL (ref 0.0–0.1)
Basophils Relative: 1 %
Eosinophils Absolute: 0.3 10*3/uL (ref 0.0–0.5)
Eosinophils Relative: 3 %
Immature Granulocytes: 0 %
Lymphocytes Relative: 29 %
Lymphs Abs: 2.9 10*3/uL (ref 0.7–4.0)
Monocytes Absolute: 0.7 10*3/uL (ref 0.1–1.0)
Monocytes Relative: 7 %
Neutro Abs: 6.1 10*3/uL (ref 1.7–7.7)
Neutrophils Relative %: 60 %

## 2022-07-10 LAB — URINALYSIS, ROUTINE W REFLEX MICROSCOPIC
Bilirubin Urine: NEGATIVE
Glucose, UA: NEGATIVE mg/dL
Hgb urine dipstick: NEGATIVE
Ketones, ur: 5 mg/dL — AB
Leukocytes,Ua: NEGATIVE
Nitrite: NEGATIVE
Protein, ur: NEGATIVE mg/dL
Specific Gravity, Urine: 1.021 (ref 1.005–1.030)
pH: 7 (ref 5.0–8.0)

## 2022-07-10 LAB — I-STAT CHEM 8, ED
BUN: 11 mg/dL (ref 6–20)
Calcium, Ion: 1.2 mmol/L (ref 1.15–1.40)
Chloride: 102 mmol/L (ref 98–111)
Creatinine, Ser: 0.7 mg/dL (ref 0.44–1.00)
Glucose, Bld: 89 mg/dL (ref 70–99)
HCT: 39 % (ref 36.0–46.0)
Hemoglobin: 13.3 g/dL (ref 12.0–15.0)
Potassium: 3.7 mmol/L (ref 3.5–5.1)
Sodium: 136 mmol/L (ref 135–145)
TCO2: 23 mmol/L (ref 22–32)

## 2022-07-10 LAB — RAPID URINE DRUG SCREEN, HOSP PERFORMED
Amphetamines: NOT DETECTED
Barbiturates: NOT DETECTED
Benzodiazepines: NOT DETECTED
Cocaine: NOT DETECTED
Opiates: NOT DETECTED
Tetrahydrocannabinol: NOT DETECTED

## 2022-07-10 LAB — CBC
HCT: 36.8 % (ref 36.0–46.0)
Hemoglobin: 12.1 g/dL (ref 12.0–15.0)
MCH: 27.5 pg (ref 26.0–34.0)
MCHC: 32.9 g/dL (ref 30.0–36.0)
MCV: 83.6 fL (ref 80.0–100.0)
Platelets: 284 10*3/uL (ref 150–400)
RBC: 4.4 MIL/uL (ref 3.87–5.11)
RDW: 14 % (ref 11.5–15.5)
WBC: 10 10*3/uL (ref 4.0–10.5)
nRBC: 0 % (ref 0.0–0.2)

## 2022-07-10 LAB — APTT: aPTT: 27 seconds (ref 24–36)

## 2022-07-10 LAB — PROTIME-INR
INR: 1 (ref 0.8–1.2)
Prothrombin Time: 12.9 seconds (ref 11.4–15.2)

## 2022-07-10 MED ORDER — CLEVIDIPINE BUTYRATE 0.5 MG/ML IV EMUL
0.0000 mg/h | INTRAVENOUS | Status: DC
  Administered 2022-07-10: 2 mg/h via INTRAVENOUS
  Filled 2022-07-10: qty 50

## 2022-07-10 MED ORDER — NITROGLYCERIN 2 % TD OINT
1.0000 [in_us] | TOPICAL_OINTMENT | Freq: Once | TRANSDERMAL | Status: AC
  Administered 2022-07-10: 1 [in_us] via TOPICAL
  Filled 2022-07-10: qty 1

## 2022-07-10 MED ORDER — LOSARTAN POTASSIUM 25 MG PO TABS
50.0000 mg | ORAL_TABLET | Freq: Once | ORAL | Status: AC
  Administered 2022-07-10: 50 mg via ORAL
  Filled 2022-07-10: qty 2

## 2022-07-10 MED ORDER — IOHEXOL 350 MG/ML SOLN
75.0000 mL | Freq: Once | INTRAVENOUS | Status: AC | PRN
  Administered 2022-07-10: 75 mL via INTRAVENOUS

## 2022-07-10 MED ORDER — CLONIDINE HCL 0.1 MG PO TABS
0.1000 mg | ORAL_TABLET | Freq: Once | ORAL | Status: AC
  Administered 2022-07-10: 0.1 mg via ORAL
  Filled 2022-07-10: qty 1

## 2022-07-10 MED ORDER — LORAZEPAM 2 MG/ML IJ SOLN
1.0000 mg | Freq: Once | INTRAMUSCULAR | Status: AC
  Administered 2022-07-10: 1 mg via INTRAVENOUS
  Filled 2022-07-10: qty 1

## 2022-07-10 NOTE — ED Provider Notes (Signed)
[place] EMERGENCY DEPARTMENT AT [facility] Provider Note   CSN: [id number] Arrival date & time: 07/10/22  4098  An emergency department physician performed an initial assessment on this suspected stroke patient at 1853.  History  Chief Complaint  Patient presents with   Hypertension    Jasmine Barnes is a 37 y.o. female.  HPI 6:30 PM patient had sudden onset of double vision and feeling of confusion.  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  She reports she felt fine before symptoms onset.  She denies sudden or severe headache.  The symptoms came on with visual disturbance perception of confusion and also perception of difficulty forming words.  Patient temporarily felt like her left arm was not coordinated.  Patient reports she does have history of paroxysmal atrial fibrillation but is not chronically anticoagulated.  She does take sotalol.  Patient reports he is compliant with her blood pressure medications and blood pressures are typically controlled.  Patient reports she took her regular medications this morning.  She reports she took her normal dose of Cozaar and sotalol this morning.      Home Medications Prior to Admission medications   Medication Sig Start Date End Date Taking? Authorizing Provider  Ascorbic Acid (VITAMIN C) 100 MG tablet Take 100 mg by mouth daily.   Yes [provider]  cloNIDine (CATAPRES) 0.1 MG tablet Take 1 oral tablet once a day FOR ANXIETY/IRRITABILITY. Patient taking differently: Take 0.1 mg by mouth daily as needed (anxiety). 06/28/22  Yes   Cyanocobalamin (VITAMIN B-12 PO) Take 1 tablet by mouth daily.   Yes [provider]  levonorgestrel (MIRENA, 52 MG,) 20 MCG/DAY IUD 1 each by Intrauterine route once. 04/04/22  Yes [provider]  lisdexamfetamine (VYVANSE) 20 MG capsule Take 1 capsule (20 mg total) by mouth every morning. 07/06/22  Yes   losartan (COZAAR) 50 MG tablet Take  1 tablet (50 mg total) by mouth 2 (two) times daily. 06/05/22 06/05/23 Yes Laurier Nancy, MD  Magnesium 250 MG TABS Take 1 tablet by mouth every evening.   Yes [provider]  rosuvastatin (CRESTOR) 10 MG tablet Take 10 mg by mouth every other day. 11/28/19  Yes [provider]  sotalol (BETAPACE) 160 MG tablet Take 1 tablet (160 mg total) by mouth 2 (two) times daily (take an extra tablet as needed for elevated heart rate) 05/11/22  Yes Laurier Nancy, MD  Vitamin D, Ergocalciferol, (DRISDOL) 1.25 MG (50000 UNIT) CAPS capsule Take 1 capsule by mouth once a week. 04/21/22  Yes Orson Eva, NP  cloNIDine (CATAPRES) 0.1 MG tablet Take 1 tablet by mouth once a day for anxiety/irritability. Patient not taking: Reported on 07/11/2022 02/15/22     lisdexamfetamine (VYVANSE) 20 MG capsule Take 1 capsule (20 mg total) by mouth in the morning. Patient not taking: Reported on 07/11/2022 05/15/22     lisdexamfetamine (VYVANSE) 20 MG capsule Take 1 capsule (20 mg total) by mouth every morning. 08/05/22     methylphenidate 18 MG PO CR tablet Take 1 tablet (18 mg total) by mouth in the morning. Patient not taking: Reported on 07/11/2022 04/27/22         Allergies    Penicillin g and Penicillins    Review of Systems   Review of Systems  Physical Exam Updated Vital Signs BP (!) 148/86   Pulse 78   Temp 97.9 F (36.6 C) (Oral)   Resp Marland Kitchen)  22   Wt 104.4 kg   SpO2 97%   BMI 40.78 kg/m  Physical Exam Constitutional:      Comments: Clear mental status.  No respiratory distress.  Anxious.  HENT:     Head: Normocephalic and atraumatic.     Mouth/Throat:     Mouth: Mucous membranes are moist.     Pharynx: Oropharynx is clear.  Eyes:     Extraocular Movements: Extraocular movements intact.     Conjunctiva/sclera: Conjunctivae normal.     Pupils: Pupils are equal, round, and reactive to light.  Cardiovascular:     Rate and Rhythm: Normal rate and regular rhythm.  Pulmonary:      Effort: Pulmonary effort is normal.     Breath sounds: Normal breath sounds.  Abdominal:     General: There is no distension.     Palpations: Abdomen is soft.     Tenderness: There is no abdominal tenderness. There is no guarding.  Musculoskeletal:        General: No swelling or tenderness. Normal range of motion.     Right lower leg: No edema.     Left lower leg: No edema.  Skin:    General: Skin is warm and dry.  Neurological:     Comments: Alert GCS 15.  Cranial nerves intact.  No visual field cut.  Speech is clear.  No receptive or expressive aphasia.  Finger-nose intact bilaterally.  Patient is requiring slight amount of additional prompting and instruction.  Grip strength 5\5 symmetric.  Extension and flexion upper extremities 5\5.  Patient can hold each lower extremity off of the bed independently against resistance.  Sensation intact to light touch.     ED Results / Procedures / Treatments   Labs (all labs ordered are listed, but only abnormal results are displayed) Labs Reviewed  COMPREHENSIVE METABOLIC PANEL - Abnormal; Notable for the following components:      Result Value   Sodium 133 (*)    All other components within normal limits  URINALYSIS, ROUTINE W REFLEX MICROSCOPIC - Abnormal; Notable for the following components:   Color, Urine STRAW (*)    Ketones, ur 5 (*)    All other components within normal limits  PROTIME-INR  APTT  CBC  DIFFERENTIAL  RAPID URINE DRUG SCREEN, HOSP PERFORMED  ETHANOL  I-STAT CHEM 8, ED  I-STAT BETA HCG BLOOD, ED ([facility], [facility], [facility] ONLY)    EKG EKG Interpretation  Date/Time:  Monday July 10 2022 18:47:18 EDT Ventricular Rate:  82 PR Interval:  160 QRS Duration: 108 QT Interval:  391 QTC Calculation: 457 R Axis:   98 Text Interpretation: Right and left arm electrode reversal, interpretation assumes no reversal Sinus rhythm Left atrial enlargement Left ventricular hypertrophy Lateral infarct, 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 EXAM: MRI HEAD WITHOUT CONTRAST TECHNIQUE: Multiplanar, multiecho pulse sequences of the brain and surrounding structures were obtained without intravenous contrast. COMPARISON:  Prior studies from earlier the same day. FINDINGS: Brain: Cerebral volume within normal limits for age. No focal parenchymal signal abnormality. No abnormal foci of restricted diffusion to suggest acute or subacute ischemia. Gray-white matter differentiation well maintained. No encephalomalacia to suggest chronic cortical infarction or other insult. No foci of susceptibility artifact indicative of acute or chronic intracranial blood products. No mass lesion, midline shift or mass effect. Ventricles  normal in size and morphology without hydrocephalus. No extra-axial fluid collection. Pituitary gland and suprasellar region within normal limits. Vascular: Major intracranial vascular flow voids are well maintained. Skull and upper cervical spine: Craniocervical junction within normal limits. Visualized upper cervical spine demonstrates no significant finding. Bone marrow signal intensity within normal limits. No scalp soft tissue abnormality. Sinuses/Orbits: Globes and orbital soft tissues are within normal limits. Paranasal sinuses are largely clear. No significant mastoid effusion. Other: None. IMPRESSION: Normal brain MRI.  No acute intracranial abnormality. Electronically Signed   By: Rise Mu M.D.   On: 07/10/2022 23:34   CT ANGIO HEAD NECK W WO CM  Result Date: 07/10/2022 CLINICAL DATA:  Initial evaluation for neuro deficit, stroke. EXAM: CT ANGIOGRAPHY HEAD AND NECK WITH AND WITHOUT CONTRAST TECHNIQUE: Multidetector CT imaging of the head and neck was performed using the standard protocol during bolus administration of intravenous  contrast. Multiplanar CT image reconstructions and MIPs were obtained to evaluate the vascular anatomy. Carotid stenosis measurements (when applicable) are obtained utilizing NASCET criteria, using the distal internal carotid diameter as the denominator. RADIATION DOSE REDUCTION: This exam was performed according to the departmental dose-optimization program which includes automated exposure control, adjustment of the mA and/or kV according to patient size and/or use of iterative reconstruction technique. CONTRAST:  75mL OMNIPAQUE IOHEXOL 350 MG/ML SOLN COMPARISON:  None Available. FINDINGS: CTA NECK FINDINGS Aortic arch: Standard branching. Imaged portion shows no evidence of aneurysm or dissection. No significant stenosis of the major arch vessel origins. Right carotid system: No evidence of dissection, stenosis (50% or greater), or occlusion. Left carotid system: No evidence of dissection, stenosis (50% or greater), or occlusion. Vertebral arteries: Both vertebral arteries arise from subclavian arteries. No proximal subclavian artery stenosis. No stenosis or dissection. Skeleton: No discrete or worrisome osseous lesions. 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 No other acute finding. Upper chest: No acute finding. Review of the MIP images confirms the above findings CTA HEAD FINDINGS Anterior circulation: Both internal carotid arteries widely patent to the termini without stenosis. A1 segments widely patent. Normal anterior communicating artery complex. Both anterior cerebral arteries widely patent to their distal aspects without stenosis. No M1 stenosis or occlusion. Normal MCA bifurcations. Distal MCA branches well perfused and symmetric. Posterior circulation: Both vertebral arteries are widely patent without stenosis. Both PICA patent. Basilar patent without stenosis. Superior cerebellar and posterior cerebral arteries patent bilaterally.  Venous sinuses: Patent allowing for timing the contrast bolus. Anatomic variants: None significant.  No aneurysm. Review of the MIP images confirms the above findings IMPRESSION: 1. Negative CTA of the head and neck. No large vessel occlusion or other emergent finding. No hemodynamically significant or correctable stenosis. 2. 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 Correlation with direct visualization suggested. Electronically Signed   By: Rise Mu M.D.   On: 07/10/2022 21:31   CT HEAD CODE STROKE WO CONTRAST  Result Date: 07/10/2022 CLINICAL DATA:  Code stroke.  Blurry and double vision EXAM: CT HEAD WITHOUT CONTRAST TECHNIQUE: Contiguous axial images were obtained from the base of the skull through the vertex without intravenous contrast. RADIATION DOSE REDUCTION: This exam was performed according to the departmental dose-optimization program which includes automated exposure control, adjustment of the mA and/or kV according to patient size and/or use of iterative reconstruction technique. COMPARISON:  09/27/2012 FINDINGS: Brain: No evidence of acute infarction, hemorrhage, mass, mass effect, or midline shift. No  hydrocephalus or extra-axial collection. Partial empty sella. Vascular: No hyperdense vessel. Skull: Negative for fracture or focal lesion. Sinuses/Orbits: No acute finding. Other: The mastoid air cells are well aerated. ASPECTS [facility] Stroke Program Early CT Score) - Ganglionic level infarction (caudate, lentiform nuclei, internal capsule, insula, M1-M3 cortex): 7 - Supraganglionic infarction (M4-M6 cortex): 3 Total score (0-10 with 10 being normal): 10 IMPRESSION: No evidence of acute intracranial abnormality. ASPECTS is 10. Code stroke imaging results were communicated on 07/10/2022 at 7:09 pm to provider PFIEFFER via telephone, who verbally acknowledged these results. Electronically Signed   By: Wiliam Ke M.D.   On: 07/10/2022 19:10     Procedures Procedures   CRITICAL CARE Performed by: Arby Barrette   Total critical care time: 30 minutes  Critical care time was exclusive of separately billable procedures and treating other patients.  Critical care was necessary to treat or prevent imminent or life-threatening deterioration.  Critical care was time spent personally by me on the following activities: development of treatment plan with patient and/or surrogate as well as nursing, discussions with consultants, evaluation of patient's response to treatment, examination of patient, obtaining history from patient or surrogate, ordering and performing treatments and interventions, ordering and review of laboratory studies, ordering and review of radiographic studies, pulse oximetry and re-evaluation of patient's condition.  Medications Ordered in ED Medications  clevidipine (CLEVIPREX) infusion 0.5 mg/mL (0 mg/hr Intravenous Paused 07/10/22 2153)  nitroGLYCERIN (NITROGLYN) 2 % ointment 1 inch (1 inch Topical Given 07/10/22 1946)  cloNIDine (CATAPRES) tablet 0.1 mg (0.1 mg Oral Given 07/10/22 1946)  LORazepam (ATIVAN) injection 1 mg (1 mg Intravenous Given 07/10/22 1943)  iohexol (OMNIPAQUE) 350 MG/ML injection 75 mL (75 mLs Intravenous Contrast Given 07/10/22 2029)  losartan (COZAAR) tablet 50 mg (50 mg Oral Given 07/10/22 2328)    ED Course/ Medical Decision Making/ A&P                             Medical Decision Making Amount and/or Complexity of Data Reviewed Labs: ordered. Radiology: ordered.  Risk Prescription drug management. Decision regarding hospitalization.  Patient presents as outlined.  Concern for intracerebral hemorrhage\CVA\hypertensive emergency.  With patient's acute onset of double vision and arm symptoms will initiate stroke workup.  My first neurologic exam had NIH of 0.  Blood pressures are significantly elevated.  18: 50 denies any headache.  She reports vision still has a slightly blurry  quality to it no double vision at this time.  No focal motor deficits.  Repeat blood pressure 220/143 manual pressure.  Repeat blood pressure 191/117.  Consult: 19: 50 reviewed with Dr. Otelia Limes.  We reviewed patient's history of present illness with onset with double vision and possible left arm symptoms.  Concerning for CVA.  However at this time and on arrival patient's NIH was 0.  Not a tPA candidate with NIH 0.  Will proceed with diagnostic workup including CT angio head and neck and MRI.  At this time with symptoms likely hypertensive emergency, will have goal blood pressure of systolic 160 or less.  And initiate Cleviprex and continue close monitoring and frequent neurologis check.  22: 58 recheck: Nitroglycerin placed and Cleviprex initiated patient reported headache.  She had not previously had headache.  Cleviprex had only been started at initial rate and not titrated.  Nitroglycerin was removed prior to MRI and patient was given Tylenol.  At this time I have had Cleviprex discontinued as blood pressures  at 148/86.  She reports headache is much improved again and no residual symptoms of feeling confusion or visual disturbance.  Consult: Reviewed Dr. Otelia Limes.  At this time with patient's risk factors of paroxysmal atrial fibrillation, significant hypertension and possible stroke symptoms will plan for admission to complete stroke workup.  Consult: Reviewed Dr. Allena Katz for admission.        Final Clinical Impression(s) / ED Diagnoses Final diagnoses:  Hypertensive emergency  Neurologic abnormality    Rx / DC Orders ED Discharge Orders     None         Arby Barrette, MD 07/19/22 1017

## 2022-07-10 NOTE — H&P (Signed)
History and Physical    Jasmine Barnes:[id number] DOB: 1985/08/26 DOA: 07/10/2022  PCP: Orson Eva, NP  Patient coming from: Home  I have personally briefly reviewed patient's old medical records in [facility]  Chief Complaint: Double vision  HPI: Jasmine Barnes is a 37 y.o. female with medical history significant for reported PAF not on anticoagulation, HTN, HLD, anxiety who presented to the ED for evaluation of sudden onset double vision, confusion, and right arm incoordination.  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  She came down to the ED for further evaluation.  She was noted to have significantly elevated blood pressure.  Blood pressure improved with initial management in the ED.  Patient states that her symptoms also began to improve his blood pressure came down.  She says that she has had episodes of significantly elevated blood pressure but has been asymptomatic during those episodes.  She says that she was diagnosed with paroxysmal atrial fibrillation this past March after wearing a heart monitor.  She has been on sotalol for rate/rhythm control.  She was prescribed Eliquis but has not taken it due to personal preference.  Patient is prescribed clonidine 0.1 mg as needed for anxiety.  She says that she does not take this on a daily basis, only as needed.  ED Course  Labs/Imaging on admission: I have personally reviewed following labs and imaging studies.  Initial vitals showed BP 201/116, pulse 78, RR 23, temp 97.8 F, SpO2 100% on room air.  Labs show WBC 10.0, hemoglobin 12.1, platelets 284,000, sodium 133, potassium 3.6, bicarb 22, BUN 12, creatinine 0.74, serum glucose 94.  UDS negative.  UA negative for UTI.  CT head without contrast negative for acute intracranial abnormality.  CTA head/neck  was a negative study.  No LVO or other emergent finding.  No hemodynamically significant uncorrectable stenosis.  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  MRI brain without contrast was a normal study.  No acute intracranial abnormality.  Patient was given clonidine 0.1 mg tablet, and topical nitroglycerin ointment, losartan 50 mg.  Patient was briefly placed on Cleviprex infusion with significant improvement in blood pressure.  Cleviprex subsequently discontinued.  EDP discussed with on-call neurology, Dr. Otelia Limes, recommendation was to admit to [facility] for CVA workup.  The hospitalist service was consulted to admit for further evaluation and management.  Review of Systems: All systems reviewed and are negative except as documented in history of present illness above.   Past Medical History:  Diagnosis Date   Anxiety    Atrial fibrillation, currently in sinus rhythm    Bipolar disorder (HCC)    Depression    Kidney stone    Leiomyoma 2021   suserosal on u/s   Mixed hyperlipidemia    PCOS (polycystic ovarian syndrome) 2021   Dr. Bonney Aid   Pre-diabetes    Primary hypertension     Past Surgical History:  Procedure Laterality Date   HERNIA REPAIR     LITHOTRIPSY     x 2   TUBAL LIGATION      Social History:  reports that she has never smoked. She has never used smokeless tobacco. She reports current alcohol use of about 2.0 - 3.0 standard drinks of alcohol per week. She reports that she does not use drugs.  Allergies  Allergen Reactions   Penicillin G Anaphylaxis   Penicillins     Family History  Problem Relation Age of Onset   Alcohol abuse Mother    Drug abuse Mother    Bipolar disorder Mother    Schizophrenia Father    Alcohol abuse Father    Drug abuse Father    Kidney disease Neg Hx    Bladder Cancer Neg Hx      Prior to Admission medications   Medication Sig Start Date End Date Taking?  Authorizing Provider  Ascorbic Acid (VITAMIN C) 100 MG tablet Take 100 mg by mouth daily.    [provider]  cloNIDine (CATAPRES) 0.1 MG tablet Take 1 tablet by mouth once a day for anxiety/irritability. 02/15/22     cloNIDine (CATAPRES) 0.1 MG tablet Take 1 oral tablet once a day FOR ANXIETY/IRRITABILITY. 06/28/22     Cyanocobalamin (VITAMIN B-12 PO) Take by mouth.    [provider]  levonorgestrel (MIRENA, 52 MG,) 20 MCG/DAY IUD 1 each by Intrauterine route once. 04/04/22   [provider]  lisdexamfetamine (VYVANSE) 20 MG capsule Take 1 capsule (20 mg total) by mouth in the morning. 05/15/22     lisdexamfetamine (VYVANSE) 20 MG capsule Take 1 capsule (20 mg total) by mouth every morning. 08/05/22     lisdexamfetamine (VYVANSE) 20 MG capsule Take 1 capsule (20 mg total) by mouth every morning. 07/06/22     losartan (COZAAR) 50 MG tablet Take 1 tablet (50 mg total) by mouth 2 (two) times daily. 06/05/22 06/05/23  Laurier Nancy, MD  Magnesium 250 MG TABS Take by mouth.    [provider]  Magnesium Gluconate 550 MG TABS Take by mouth.    [provider]  methylphenidate 18 MG PO CR tablet Take 1 tablet (18 mg total) by mouth in the morning. 04/27/22     rosuvastatin (CRESTOR) 10 MG tablet TAKE ONE TABLET BY MOUTH ONCE NIGHTLY AT BEDTIME 11/28/19   [provider]  sotalol (BETAPACE) 160 MG tablet Take 1 tablet (160 mg total) by mouth 2 (two) times daily (take an extra tablet as needed for elevated heart rate) 05/11/22   Laurier Nancy, MD  Vitamin D, Ergocalciferol, (DRISDOL) 1.25 MG (50000 UNIT) CAPS capsule Take 1 capsule by mouth once a week. 04/21/22   Orson Eva, NP    Physical Exam: Vitals:   07/10/22 1900 07/10/22 2015 07/10/22 2145 07/10/22 2243  BP: (!) 197/117 (!) 177/108 (!) 148/86   Pulse: 75 79 78   Resp: 16 18 (!) 22   Temp:    97.9 F (36.6 C)  TempSrc:    Oral  SpO2: 100% 97% 97%   Weight: 104.4 kg      Constitutional:  Resting in bed, NAD, calm, comfortable Eyes: EOMI, lids and conjunctivae normal ENMT: Mucous membranes are moist. Posterior pharynx clear of any exudate or lesions.Normal dentition.  Neck: normal, supple, no masses. Respiratory: clear to auscultation bilaterally, no wheezing, no crackles. Normal respiratory effort. No accessory muscle use.  Cardiovascular: Regular rate and rhythm, no murmurs / rubs / gallops. No extremity edema. 2+ pedal pulses. Abdomen: no tenderness, no masses palpated. Musculoskeletal: no clubbing / cyanosis. No joint deformity upper and lower extremities. Good ROM, no contractures. Normal muscle tone.  Skin: no rashes, lesions, ulcers. No induration Neurologic: CN 2-12 grossly intact. Sensation intact. Strength 5/5 in all 4.  Psychiatric: Alert and oriented x 3. Normal mood.   EKG: Personally reviewed. Sinus  rhythm, rate 77, no acute ischemic changes.  Previous EKGs in system showed normal sinus rhythm.  Assessment/Plan Principal Problem:   Double vision Active Problems:   Paroxysmal atrial fibrillation (HCC)   Hypertensive urgency   Hyperlipidemia   Jasmine Barnes is a 37 y.o. female with medical history significant for reported PAF not on anticoagulation, HTN, HLD, anxiety who presented with sudden onset transient double vision, confusion, right arm incoordination and admitted for workup of CVA versus hypertensive emergency.  Assessment and Plan: Transient double vision and right arm incoordination: Symptoms felt secondary to hypertensive emergency versus TIA.  CTA head/neck and MRI brain are normal studies.  EDP discussed with neurology who recommended admission to [facility] for CVA workup given her risk factors (HTN, A-fib). -Admit to [facility] -Neurology to follow -Keep on telemetry, continue neurochecks -Obtain echocardiogram -Start on at least aspirin 81 mg daily given A-fib -Continue rosuvastatin -PT/OT/SLP eval  Paroxysmal atrial  fibrillation: Per patient diagnosed earlier this year the heart monitor.  Remains in sinus rhythm with controlled rate on admission. -Continue sotalol 160 mg twice daily -CHA2DS2-VASc score is 2 (HTN, gender) -Start aspirin 81 mg as above; consider change to Eliquis if workup of presenting symptoms suggestive of embolic CVA.  Hypertension: Significantly hypertensive on presentation, BP and symptoms now improved.  Restart on home losartan in AM.  IV hydralazine as needed.  Hyperlipidemia: Continue rosuvastatin.   DVT prophylaxis: enoxaparin (LOVENOX) injection 40 mg Start: 07/11/22 1000 Code Status: Full code, confirmed with patient on admission Family Communication: Discussed with patient, she has discussed with family Disposition Plan: From home and likely discharge to home pending clinical progress Consults called: Neurology Dr. Otelia Limes Severity of Illness: The appropriate patient status for this patient is OBSERVATION. Observation status is judged to be reasonable and necessary in order to provide the required intensity of service to ensure the patient's safety. The patient's presenting symptoms, physical exam findings, and initial radiographic and laboratory data in the context of their medical condition is felt to place them at decreased risk for further clinical deterioration. Furthermore, it is anticipated that the patient will be medically stable for discharge from the hospital within 2 midnights of admission.   Darreld Mclean MD [facility]  If 7PM-7AM, please contact night-coverage [email or web address]  07/11/2022, 1:15 AM

## 2022-07-10 NOTE — ED Notes (Signed)
NIH, GCS, Vitals accidentally charted under wrong RN. This RN completed eval.

## 2022-07-10 NOTE — ED Triage Notes (Signed)
Pt arrives with c/o sudden onset on confusion, double vision. Pt has hx of hypertension and Afib.

## 2022-07-11 ENCOUNTER — Ambulatory Visit (INDEPENDENT_AMBULATORY_CARE_PROVIDER_SITE_OTHER): Payer: 59 | Admitting: Cardiovascular Disease

## 2022-07-11 ENCOUNTER — Other Ambulatory Visit (HOSPITAL_COMMUNITY): Payer: Self-pay

## 2022-07-11 ENCOUNTER — Encounter: Payer: Self-pay | Admitting: Cardiovascular Disease

## 2022-07-11 VITALS — BP 140/100 | HR 89 | Ht 63.0 in | Wt 216.4 lb

## 2022-07-11 DIAGNOSIS — Z8679 Personal history of other diseases of the circulatory system: Secondary | ICD-10-CM | POA: Diagnosis not present

## 2022-07-11 DIAGNOSIS — I160 Hypertensive urgency: Secondary | ICD-10-CM

## 2022-07-11 DIAGNOSIS — E782 Mixed hyperlipidemia: Secondary | ICD-10-CM

## 2022-07-11 DIAGNOSIS — E785 Hyperlipidemia, unspecified: Secondary | ICD-10-CM

## 2022-07-11 DIAGNOSIS — I480 Paroxysmal atrial fibrillation: Secondary | ICD-10-CM

## 2022-07-11 DIAGNOSIS — H532 Diplopia: Secondary | ICD-10-CM | POA: Diagnosis present

## 2022-07-11 MED ORDER — ACETAMINOPHEN 325 MG PO TABS: 650.0000 mg | ORAL_TABLET | ORAL | Status: DC | PRN

## 2022-07-11 MED ORDER — HYDRALAZINE HCL 20 MG/ML IJ SOLN: 10.0000 mg | INTRAMUSCULAR | Status: DC | PRN

## 2022-07-11 MED ORDER — STROKE: EARLY STAGES OF RECOVERY BOOK: Freq: Once | Status: DC

## 2022-07-11 MED ORDER — SOTALOL HCL 80 MG PO TABS: 160.0000 mg | ORAL_TABLET | Freq: Two times a day (BID) | ORAL | Status: DC

## 2022-07-11 MED ORDER — ROSUVASTATIN CALCIUM 20 MG PO TABS: 10.0000 mg | ORAL_TABLET | ORAL | Status: DC

## 2022-07-11 MED ORDER — ACETAMINOPHEN 650 MG RE SUPP: 650.0000 mg | RECTAL | Status: DC | PRN

## 2022-07-11 MED ORDER — SENNOSIDES-DOCUSATE SODIUM 8.6-50 MG PO TABS: 1.0000 | ORAL_TABLET | Freq: Every evening | ORAL | Status: DC | PRN

## 2022-07-11 MED ORDER — LISDEXAMFETAMINE DIMESYLATE 20 MG PO CAPS: 20.0000 mg | ORAL_CAPSULE | Freq: Every morning | ORAL | Status: DC

## 2022-07-11 MED ORDER — ACETAMINOPHEN 160 MG/5ML PO SOLN: 650.0000 mg | ORAL | Status: DC | PRN

## 2022-07-11 MED ORDER — ENOXAPARIN SODIUM 40 MG/0.4ML IJ SOSY: 40.0000 mg | PREFILLED_SYRINGE | INTRAMUSCULAR | Status: DC

## 2022-07-11 MED ORDER — ONDANSETRON HCL 4 MG/2ML IJ SOLN: 4.0000 mg | Freq: Four times a day (QID) | INTRAMUSCULAR | Status: DC | PRN

## 2022-07-11 MED ORDER — ASPIRIN 81 MG PO TBEC: 81.0000 mg | DELAYED_RELEASE_TABLET | Freq: Every day | ORAL | Status: DC

## 2022-07-11 MED ORDER — LOSARTAN POTASSIUM 25 MG PO TABS: 50.0000 mg | ORAL_TABLET | Freq: Two times a day (BID) | ORAL | Status: DC

## 2022-07-11 NOTE — Hospital Course (Addendum)
Jasmine Barnes is a 37 y.o. female with medical history significant for reported PAF not on anticoagulation, HTN, HLD, anxiety who presented with sudden onset transient double vision, confusion, right arm incoordination and admitted for workup of CVA versus hypertensive emergency.

## 2022-07-11 NOTE — Progress Notes (Signed)
Cardiology Office Note   Date:  07/11/2022   ID:  Jasmine Barnes, DOB 14-Sep-1985, MRN [id number]  PCP:  Orson Eva, NP  Cardiologist:  Adrian Blackwater, MD      History of Present Illness: Jasmine Barnes is a 37 y.o. female who presents for  Chief Complaint  Patient presents with   Follow-up    F/U    Went to ER BPS 200, and had blurred vision and foggy.      Past Medical History:  Diagnosis Date   Anxiety    Atrial fibrillation, currently in sinus rhythm    Bipolar disorder (HCC)    Depression    Kidney stone    Leiomyoma 2021   suserosal on u/s   Mixed hyperlipidemia    PCOS (polycystic ovarian syndrome) 2021   Dr. Bonney Aid   Pre-diabetes    Primary hypertension      Past Surgical History:  Procedure Laterality Date   HERNIA REPAIR     LITHOTRIPSY     x 2   TUBAL LIGATION       Current Outpatient Medications  Medication Sig Dispense Refill   Ascorbic Acid (VITAMIN C) 100 MG tablet Take 100 mg by mouth daily.     cloNIDine (CATAPRES) 0.1 MG tablet Take 1 oral tablet once a day FOR ANXIETY/IRRITABILITY. (Patient taking differently: Take 0.1 mg by mouth daily as needed (anxiety).) 30 tablet 1   Cyanocobalamin (VITAMIN B-12 PO) Take 1 tablet by mouth daily.     levonorgestrel (MIRENA, 52 MG,) 20 MCG/DAY IUD 1 each by Intrauterine route once.     [START ON 08/05/2022] lisdexamfetamine (VYVANSE) 20 MG capsule Take 1 capsule (20 mg total) by mouth every morning. 30 capsule 0   lisdexamfetamine (VYVANSE) 20 MG capsule Take 1 capsule (20 mg total) by mouth every morning. 30 capsule 0   losartan (COZAAR) 50 MG tablet Take 1 tablet (50 mg total) by mouth 2 (two) times daily. 180 tablet 1   Magnesium 250 MG TABS Take 1 tablet by mouth every evening.     rosuvastatin (CRESTOR) 10 MG tablet Take 10 mg by mouth every other day.     sotalol (BETAPACE) 160 MG tablet Take 1 tablet (160 mg total) by mouth 2 (two) times daily (take an extra tablet as needed for  elevated heart rate) 60 tablet 2   Vitamin D, Ergocalciferol, (DRISDOL) 1.25 MG (50000 UNIT) CAPS capsule Take 1 capsule by mouth once a week. 32 capsule 0   No current facility-administered medications for this visit.    Allergies:   Penicillin g and Penicillins    Social History:   reports that she has never smoked. She has never used smokeless tobacco. She reports current alcohol use of about 2.0 - 3.0 standard drinks of alcohol per week. She reports that she does not use drugs.   Family History:  family history includes Alcohol abuse in her father and mother; Bipolar disorder in her mother; Drug abuse in her father and mother; Schizophrenia in her father.    ROS:     Review of Systems  Constitutional: Negative.   HENT: Negative.    Eyes: Negative.   Respiratory: Negative.    Gastrointestinal: Negative.   Genitourinary: Negative.   Musculoskeletal: Negative.   Skin: Negative.   Neurological: Negative.   Endo/Heme/Allergies: Negative.   Psychiatric/Behavioral: Negative.    All other systems reviewed and are negative.     All other systems are reviewed and negative.  PHYSICAL EXAM: VS:  BP (!) 140/100   Pulse 89   Ht 5\' 3"  (1.6 m)   Wt 216 lb 6.4 oz (98.2 kg)   SpO2 98%   BMI 38.33 kg/m  , BMI Body mass index is 38.33 kg/m. Last weight:  Wt Readings from Last 3 Encounters:  07/11/22 216 lb 6.4 oz (98.2 kg)  05/11/22 216 lb 9.6 oz (98.2 kg)  04/10/22 212 lb (96.2 kg)     Physical Exam Constitutional:      Appearance: Normal appearance.  Cardiovascular:     Rate and Rhythm: Normal rate and regular rhythm.     Heart sounds: Normal heart sounds.  Pulmonary:     Effort: Pulmonary effort is normal.     Breath sounds: Normal breath sounds.  Musculoskeletal:     Right lower leg: No edema.     Left lower leg: No edema.  Neurological:     Mental Status: She is alert.       EKG:   Recent Labs: 07/10/2022: ALT 22; BUN 11; Creatinine, Ser 0.70; Hemoglobin  13.3; Platelets 284; Potassium 3.7; Sodium 136    Lipid Panel No results found for: "CHOL", "TRIG", "HDL", "CHOLHDL", "VLDL", "LDLCALC", "LDLDIRECT"    Other studies Reviewed: Additional studies/ records that were reviewed today include:  Review of the above records demonstrates:       No data to display            ASSESSMENT AND PLAN:    ICD-10-CM   1. Hypertensive urgency  I16.0 PCV ECHOCARDIOGRAM COMPLETE   repeat BP 130/80, advise taking extra dose clonidine 0.1 mg if BPS 180    2. Paroxysmal atrial fibrillation (HCC)  I48.0 PCV ECHOCARDIOGRAM COMPLETE    3. Atrial fibrillation, currently in sinus rhythm  Z86.79 PCV ECHOCARDIOGRAM COMPLETE   Check echo    4. Mixed hyperlipidemia  E78.2 PCV ECHOCARDIOGRAM COMPLETE    5. Double vision  H53.2 PCV ECHOCARDIOGRAM COMPLETE       Problem List Items Addressed This Visit       Cardiovascular and Mediastinum   Paroxysmal atrial fibrillation (HCC)   Relevant Orders   PCV ECHOCARDIOGRAM COMPLETE   Hypertensive urgency - Primary   Relevant Orders   PCV ECHOCARDIOGRAM COMPLETE     Other   Atrial fibrillation, currently in sinus rhythm   Relevant Orders   PCV ECHOCARDIOGRAM COMPLETE   Double vision   Relevant Orders   PCV ECHOCARDIOGRAM COMPLETE   Hyperlipidemia   Relevant Orders   PCV ECHOCARDIOGRAM COMPLETE       Disposition:   No follow-ups on file.    Total time spent: 30 minutes  Signed,  Adrian Blackwater, MD  07/11/2022 10:04 AM    [facility]

## 2022-07-11 NOTE — Progress Notes (Signed)
                                                  Against Medical Advice Patient at this time expresses desire to leave the Hospital immediately, patient has been warned that this is not Medically advisable at this time, and can result in Medical complications like Death and Disability, patient understands and accepts the risks involved and assumes full responsibilty of this decision.  This patient has also been advised that if they feel the need for further medical assistance to return to any available ER or dial [phone].  Informed by Nursing staff that this patient has left care and has signed the form  Against Medical Advice on 07/11/2022 at 0125 Hrs  Chinita Greenland BSN MSNA MSN ACNPC-AG Acute Care Nurse Practitioner [facility]

## 2022-07-11 NOTE — ED Notes (Signed)
Pt wants to leave AMA. Pt advised to stay but stated she rather do outpatient in the morning.

## 2022-07-14 ENCOUNTER — Encounter: Payer: Self-pay | Admitting: Cardiovascular Disease

## 2022-07-17 ENCOUNTER — Encounter: Payer: Self-pay | Admitting: Cardiovascular Disease

## 2022-07-17 ENCOUNTER — Ambulatory Visit (INDEPENDENT_AMBULATORY_CARE_PROVIDER_SITE_OTHER): Payer: 59 | Admitting: Family

## 2022-07-17 ENCOUNTER — Encounter: Payer: Self-pay | Admitting: Family

## 2022-07-17 VITALS — BP 110/69 | HR 79 | Ht 63.0 in | Wt 220.0 lb

## 2022-07-17 DIAGNOSIS — R7303 Prediabetes: Secondary | ICD-10-CM | POA: Diagnosis not present

## 2022-07-18 ENCOUNTER — Ambulatory Visit: Payer: 59 | Admitting: Cardiovascular Disease

## 2022-07-18 ENCOUNTER — Ambulatory Visit (INDEPENDENT_AMBULATORY_CARE_PROVIDER_SITE_OTHER): Payer: 59 | Admitting: Cardiovascular Disease

## 2022-07-18 ENCOUNTER — Other Ambulatory Visit (HOSPITAL_COMMUNITY): Payer: Self-pay

## 2022-07-18 ENCOUNTER — Encounter: Payer: Self-pay | Admitting: Cardiovascular Disease

## 2022-07-18 ENCOUNTER — Other Ambulatory Visit: Payer: Self-pay | Admitting: Nurse Practitioner

## 2022-07-18 VITALS — BP 160/95 | Ht 63.0 in | Wt 220.0 lb

## 2022-07-18 VITALS — BP 160/95 | HR 75 | Ht 63.0 in | Wt 220.0 lb

## 2022-07-18 DIAGNOSIS — I10 Essential (primary) hypertension: Secondary | ICD-10-CM

## 2022-07-18 DIAGNOSIS — Z8679 Personal history of other diseases of the circulatory system: Secondary | ICD-10-CM

## 2022-07-18 DIAGNOSIS — E782 Mixed hyperlipidemia: Secondary | ICD-10-CM

## 2022-07-18 DIAGNOSIS — I480 Paroxysmal atrial fibrillation: Secondary | ICD-10-CM

## 2022-07-18 LAB — HEMOGLOBIN A1C
Est. average glucose Bld gHb Est-mCnc: 105 mg/dL
Hgb A1c MFr Bld: 5.3 % (ref 4.8–5.6)

## 2022-07-18 MED ORDER — SPIRONOLACTONE 25 MG PO TABS
25.0000 mg | ORAL_TABLET | Freq: Every day | ORAL | 11 refills | Status: DC
Start: 2022-07-18 — End: 2022-07-18
  Filled 2022-07-18: qty 30, 30d supply, fill #0

## 2022-07-18 MED ORDER — SPIRONOLACTONE 25 MG PO TABS
25.0000 mg | ORAL_TABLET | Freq: Every day | ORAL | 11 refills | Status: DC
Start: 2022-07-18 — End: 2022-08-28

## 2022-07-18 NOTE — Progress Notes (Signed)
Cardiology Office Note   Date:  07/18/2022   ID:  [name], DOB February 07, 1985, MRN [id number]  PCP:  Jasmine Eva, NP  Cardiologist:  Adrian Blackwater, MD      History of Present Illness: Jasmine Barnes is a 37 y.o. female who presents for  Chief Complaint  Patient presents with   Acute Visit    High BP    Has head aches and blurring of vision and BP is high. Has tension is in neck.      Past Medical History:  Diagnosis Date   Anxiety    Atrial fibrillation, currently in sinus rhythm    Bipolar disorder (HCC)    Depression    Kidney stone    Leiomyoma 2021   suserosal on u/s   Mixed hyperlipidemia    PCOS (polycystic ovarian syndrome) 2021   Dr. Bonney Aid   Pre-diabetes    Primary hypertension      Past Surgical History:  Procedure Laterality Date   HERNIA REPAIR     LITHOTRIPSY     x 2   TUBAL LIGATION       Current Outpatient Medications  Medication Sig Dispense Refill   spironolactone (ALDACTONE) 25 MG tablet Take 1 tablet (25 mg total) by mouth daily. 30 tablet 11   Ascorbic Acid (VITAMIN C) 100 MG tablet Take 100 mg by mouth daily.     cloNIDine (CATAPRES) 0.1 MG tablet Take 1 tablet (0.1 mg total) by mouth daily for anxiety/irritability 30 tablet 1   Cyanocobalamin (VITAMIN B-12 PO) Take 1 tablet by mouth daily.     levonorgestrel (MIRENA, 52 MG,) 20 MCG/DAY IUD 1 each by Intrauterine route once.     [START ON 08/05/2022] lisdexamfetamine (VYVANSE) 20 MG capsule Take 1 capsule (20 mg total) by mouth every morning. 30 capsule 0   lisdexamfetamine (VYVANSE) 20 MG capsule Take 1 capsule (20 mg total) by mouth every morning. 30 capsule 0   losartan (COZAAR) 50 MG tablet Take 1 tablet (50 mg total) by mouth 2 (two) times daily. 180 tablet 1   Magnesium 250 MG TABS Take 1 tablet by mouth every evening.     rosuvastatin (CRESTOR) 10 MG tablet Take 10 mg by mouth every other day.     sotalol (BETAPACE) 160 MG tablet Take 1 tablet (160 mg total) by  mouth 2 (two) times daily (take an extra tablet as needed for elevated heart rate) 60 tablet 2   Vitamin D, Ergocalciferol, (DRISDOL) 1.25 MG (50000 UNIT) CAPS capsule Take 1 capsule by mouth once a week. 32 capsule 0   No current facility-administered medications for this visit.    Allergies:   Penicillin g and Penicillins    Social History:   reports that she has never smoked. She has never used smokeless tobacco. She reports current alcohol use of about 2.0 - 3.0 standard drinks of alcohol per week. She reports that she does not use drugs.   Family History:  family history includes Alcohol abuse in her father and mother; Bipolar disorder in her mother; Drug abuse in her father and mother; Schizophrenia in her father.    ROS:     Review of Systems  Constitutional: Negative.   HENT: Negative.    Eyes: Negative.   Respiratory: Negative.    Gastrointestinal: Negative.   Genitourinary: Negative.   Musculoskeletal: Negative.   Skin: Negative.   Neurological: Negative.   Endo/Heme/Allergies: Negative.   Psychiatric/Behavioral: Negative.    All other  systems reviewed and are negative.     All other systems are reviewed and negative.    PHYSICAL EXAM: VS:  BP (!) 160/95   Pulse 75   Ht 5\' 3"  (1.6 m)   Wt 220 lb (99.8 kg)   SpO2 98%   BMI 38.97 kg/m  , BMI Body mass index is 38.97 kg/m. Last weight:  Wt Readings from Last 3 Encounters:  07/18/22 220 lb (99.8 kg)  07/17/22 220 lb (99.8 kg)  07/11/22 216 lb 6.4 oz (98.2 kg)     Physical Exam Constitutional:      Appearance: Normal appearance.  Cardiovascular:     Rate and Rhythm: Normal rate and regular rhythm.     Heart sounds: Normal heart sounds.  Pulmonary:     Effort: Pulmonary effort is normal.     Breath sounds: Normal breath sounds.  Musculoskeletal:     Right lower leg: No edema.     Left lower leg: No edema.  Neurological:     Mental Status: She is alert.       EKG:   Recent Labs: 07/10/2022:  ALT 22; BUN 11; Creatinine, Ser 0.70; Hemoglobin 13.3; Platelets 284; Potassium 3.7; Sodium 136    Lipid Panel No results found for: "CHOL", "TRIG", "HDL", "CHOLHDL", "VLDL", "LDLCALC", "LDLDIRECT"    Other studies Reviewed: Additional studies/ records that were reviewed today include:  Review of the above records demonstrates:       No data to display            ASSESSMENT AND PLAN:    ICD-10-CM   1. Paroxysmal atrial fibrillation (HCC)  I48.0 spironolactone (ALDACTONE) 25 MG tablet    Basic metabolic panel    2. Primary hypertension  I10 spironolactone (ALDACTONE) 25 MG tablet    Basic metabolic panel   ADD ALDACTONE 25 DAILY AS HAS WATER RETENSION AS AFRICAN-AMERICAN    3. Atrial fibrillation, currently in sinus rhythm  Z86.79 spironolactone (ALDACTONE) 25 MG tablet    Basic metabolic panel    4. Mixed hyperlipidemia  E78.2 spironolactone (ALDACTONE) 25 MG tablet    Basic metabolic panel       Problem List Items Addressed This Visit       Cardiovascular and Mediastinum   Primary hypertension   Relevant Medications   spironolactone (ALDACTONE) 25 MG tablet   Other Relevant Orders   Basic metabolic panel   Paroxysmal atrial fibrillation (HCC) - Primary   Relevant Medications   spironolactone (ALDACTONE) 25 MG tablet   Other Relevant Orders   Basic metabolic panel     Other   Atrial fibrillation, currently in sinus rhythm   Relevant Medications   spironolactone (ALDACTONE) 25 MG tablet   Other Relevant Orders   Basic metabolic panel   Hyperlipidemia   Relevant Medications   spironolactone (ALDACTONE) 25 MG tablet   Other Relevant Orders   Basic metabolic panel       Disposition:   No follow-ups on file.    Total time spent: 35 minutes  Signed,  Adrian Blackwater, MD  07/18/2022 11:38 AM    [facility]

## 2022-07-19 ENCOUNTER — Encounter: Payer: Self-pay | Admitting: Cardiovascular Disease

## 2022-07-21 ENCOUNTER — Other Ambulatory Visit (HOSPITAL_COMMUNITY): Payer: Self-pay

## 2022-07-21 NOTE — Progress Notes (Signed)
Established Patient Office Visit  Subjective:  Patient ID: Jasmine Barnes, female    DOB: [birth year]  Age: 37 y.o. MRN: [id number]  Chief Complaint  Patient presents with   Follow-up    Weight loss Meds    Patient is here today for follow up appointment, mostly to discuss weight loss meds.  She was previously on something, but received notice that [facility] plan is no longer covering what she was on.  She asks if there are any alternatives we can try.  No other concerns at this time.   Past Medical History:  Diagnosis Date   Anxiety    Atrial fibrillation, currently in sinus rhythm    Bipolar disorder (HCC)    Depression    Kidney stone    Leiomyoma 2021   suserosal on u/s   Mixed hyperlipidemia    PCOS (polycystic ovarian syndrome) 2021   Dr. Bonney Aid   Pre-diabetes    Primary hypertension     Past Surgical History:  Procedure Laterality Date   HERNIA REPAIR     LITHOTRIPSY     x 2   TUBAL LIGATION      Social History   Socioeconomic History   Marital status: Married    Spouse name: Not on file   Number of children: Not on file   Years of education: Not on file   Highest education level: Not on file  Occupational History   Not on file  Tobacco Use   Smoking status: Never   Smokeless tobacco: Never  Vaping Use   Vaping Use: Never used  Substance and Sexual Activity   Alcohol use: Yes    Alcohol/week: 2.0 - 3.0 standard drinks of alcohol    Types: 2 - 3 Standard drinks or equivalent per week    Comment: wine, mixed drinks, occasional beer   Drug use: No    Comment: quit using marijuana 3 years ago   Sexual activity: Yes    Birth control/protection: None, Surgical    Comment: Tubal Ligation  Other Topics Concern   Not on file  Social History Narrative   Not on file   Social Determinants of Health   Financial Resource Strain: Not on file  Food Insecurity: Not on file  Transportation Needs: Not on file  Physical Activity: Not on file   Stress: Not on file  Social Connections: Not on file  Intimate Partner Violence: Not on file    Family History  Problem Relation Age of Onset   Alcohol abuse Mother    Drug abuse Mother    Bipolar disorder Mother    Schizophrenia Father    Alcohol abuse Father    Drug abuse Father    Kidney disease Neg Hx    Bladder Cancer Neg Hx     Allergies  Allergen Reactions   Penicillin G Anaphylaxis   Penicillins     Review of Systems  All other systems reviewed and are negative.      Objective:   BP 110/69   Pulse 79   Ht 5\' 3"  (1.6 m)   Wt 220 lb (99.8 kg)   SpO2 99%   BMI 38.97 kg/m   Vitals:   07/17/22 1309  BP: 110/69  Pulse: 79  Height: 5\' 3"  (1.6 m)  Weight: 220 lb (99.8 kg)  SpO2: 99%  BMI (Calculated): 38.98    Physical Exam Vitals and nursing note reviewed.  Constitutional:      Appearance: Normal appearance. She is  normal weight.  HENT:     Head: Normocephalic.  Eyes:     Conjunctiva/sclera: Conjunctivae normal.     Pupils: Pupils are equal, round, and reactive to light.  Cardiovascular:     Rate and Rhythm: Normal rate.  Pulmonary:     Effort: Pulmonary effort is normal.  Neurological:     General: No focal deficit present.     Mental Status: She is alert and oriented to person, place, and time. Mental status is at baseline.  Psychiatric:        Mood and Affect: Mood normal.        Behavior: Behavior normal.        Thought Content: Thought content normal.        Judgment: Judgment normal.      Results for orders placed or performed in visit on 07/17/22  Hemoglobin A1c  Result Value Ref Range   Hgb A1c MFr Bld 5.3 4.8 - 5.6 %   Est. average glucose Bld gHb Est-mCnc 105 mg/dL    Recent Results (from the past 2160 hour(s))  Protime-INR     Status: None   Collection Time: 07/10/22  6:49 PM  Result Value Ref Range   Prothrombin Time 12.9 11.4 - 15.2 seconds   INR 1.0 0.8 - 1.2    Comment: (NOTE) INR goal varies based on device and  disease states. Performed at [facility], [address] [place], [place] [address]   APTT     Status: None   Collection Time: 07/10/22  6:49 PM  Result Value Ref Range   aPTT 27 24 - 36 seconds    Comment: Performed at [facility], [address], [place], [place] [address]  CBC     Status: None   Collection Time: 07/10/22  6:49 PM  Result Value Ref Range   WBC 10.0 4.0 - 10.5 K/uL    Comment: WHITE COUNT CONFIRMED ON SMEAR   RBC 4.40 3.87 - 5.11 MIL/uL   Hemoglobin 12.1 12.0 - 15.0 g/dL   HCT 95.2 84.1 - 32.4 %   MCV 83.6 80.0 - 100.0 fL   MCH 27.5 26.0 - 34.0 pg   MCHC 32.9 30.0 - 36.0 g/dL   RDW 40.1 02.7 - 25.3 %   Platelets 284 150 - 400 K/uL   nRBC 0.0 0.0 - 0.2 %    Comment: Performed at [facility], [address], [place], [place] [address]  Differential     Status: None   Collection Time: 07/10/22  6:49 PM  Result Value Ref Range   Neutrophils Relative % 60 %   Neutro Abs 6.1 1.7 - 7.7 K/uL   Lymphocytes Relative 29 %   Lymphs Abs 2.9 0.7 - 4.0 K/uL   Monocytes Relative 7 %   Monocytes Absolute 0.7 0.1 - 1.0 K/uL   Eosinophils Relative 3 %   Eosinophils Absolute 0.3 0.0 - 0.5 K/uL   Basophils Relative 1 %   Basophils Absolute 0.1 0.0 - 0.1 K/uL   Immature Granulocytes 0 %   Abs Immature Granulocytes 0.01 0.00 - 0.07 K/uL    Comment: Performed at [facility], [address], [place], [place] [address]  Comprehensive metabolic panel     Status: Abnormal   Collection Time: 07/10/22  6:49 PM  Result Value Ref Range   Sodium 133 (L) 135 - 145 mmol/L   Potassium 3.6 3.5 - 5.1 mmol/L   Chloride 102 98 -  111 mmol/L   CO2 22 22 - 32 mmol/L   Glucose, Bld 94 70 - 99 mg/dL    Comment: Glucose reference range applies only to samples taken after fasting for at least 8 hours.   BUN 12 6 - 20 mg/dL   Creatinine, Ser 1.61 0.44 - 1.00 mg/dL   Calcium 9.0 8.9 - 09.6 mg/dL   Total Protein 7.7  6.5 - 8.1 g/dL   Albumin 4.1 3.5 - 5.0 g/dL   AST 24 15 - 41 U/L   ALT 22 0 - 44 U/L   Alkaline Phosphatase 66 38 - 126 U/L   Total Bilirubin 0.5 0.3 - 1.2 mg/dL   GFR, Estimated >04 >54 mL/min    Comment: (NOTE) Calculated using the CKD-EPI Creatinine Equation (2021)    Anion gap 9 5 - 15    Comment: Performed at [facility], [address], [place], [place] [address]  Dickie La [address], ED     Status: None   Collection Time: 07/10/22  6:56 PM  Result Value Ref Range   Sodium 136 135 - 145 mmol/L   Potassium 3.7 3.5 - 5.1 mmol/L   Chloride 102 98 - 111 mmol/L   BUN 11 6 - 20 mg/dL   Creatinine, Ser 9.14 0.44 - 1.00 mg/dL   Glucose, Bld 89 70 - 99 mg/dL    Comment: Glucose reference range applies only to samples taken after fasting for at least 8 hours.   Calcium, Ion 1.20 1.15 - 1.40 mmol/L   TCO2 23 22 - 32 mmol/L   Hemoglobin 13.3 12.0 - 15.0 g/dL   HCT 78.2 95.6 - 21.3 %  Urine rapid drug screen (hosp performed)     Status: None   Collection Time: 07/10/22  8:52 PM  Result Value Ref Range   Opiates NONE DETECTED NONE DETECTED   Cocaine NONE DETECTED NONE DETECTED   Benzodiazepines NONE DETECTED NONE DETECTED   Amphetamines NONE DETECTED NONE DETECTED   Tetrahydrocannabinol NONE DETECTED NONE DETECTED   Barbiturates NONE DETECTED NONE DETECTED    Comment: (NOTE) DRUG SCREEN FOR MEDICAL PURPOSES ONLY.  IF CONFIRMATION IS NEEDED FOR ANY PURPOSE, NOTIFY LAB WITHIN 5 DAYS.  LOWEST DETECTABLE LIMITS FOR URINE DRUG SCREEN Drug Class                     Cutoff (ng/mL) Amphetamine and metabolites    1000 Barbiturate and metabolites    200 Benzodiazepine                 200 Opiates and metabolites        300 Cocaine and metabolites        300 THC                            50 Performed at [facility], [address], [place], [place] [address]   Urinalysis, Routine w reflex microscopic -Urine, Clean Catch     Status: Abnormal    Collection Time: 07/10/22  8:52 PM  Result Value Ref Range   Color, Urine STRAW (A) YELLOW   APPearance CLEAR CLEAR   Specific Gravity, Urine 1.021 1.005 - 1.030   pH 7.0 5.0 - 8.0   Glucose, UA NEGATIVE NEGATIVE mg/dL   Hgb urine dipstick NEGATIVE NEGATIVE   Bilirubin Urine NEGATIVE NEGATIVE   Ketones, ur 5 (A) NEGATIVE mg/dL   Protein, ur NEGATIVE NEGATIVE mg/dL  Nitrite NEGATIVE NEGATIVE   Leukocytes,Ua NEGATIVE NEGATIVE    Comment: Performed at [facility], [address] [place], [place] [address]  Hemoglobin A1c     Status: None   Collection Time: 07/17/22  1:46 PM  Result Value Ref Range   Hgb A1c MFr Bld 5.3 4.8 - 5.6 %    Comment:          Prediabetes: 5.7 - 6.4          Diabetes: >6.4          Glycemic control for adults with diabetes: <7.0    Est. average glucose Bld gHb Est-mCnc 105 mg/dL       Assessment & Plan:   Problem List Items Addressed This Visit   None Visit Diagnoses     Prediabetes    -  Primary   Relevant Orders   Hemoglobin A1c (Completed)      Checking labs today.  Will contact pt with results and let her know what next steps are for her.   No follow-ups on file.   Total time spent: 20 minutes  Miki Kins, FNP  07/17/2022   This document may have been prepared by [facility] [employer] Voice Recognition software and as such may include unintentional dictation errors.

## 2022-07-22 ENCOUNTER — Encounter: Payer: Self-pay | Admitting: Cardiovascular Disease

## 2022-08-01 ENCOUNTER — Other Ambulatory Visit (HOSPITAL_COMMUNITY): Payer: Self-pay

## 2022-08-01 ENCOUNTER — Encounter: Payer: Self-pay | Admitting: Cardiovascular Disease

## 2022-08-01 DIAGNOSIS — F4312 Post-traumatic stress disorder, chronic: Secondary | ICD-10-CM | POA: Diagnosis not present

## 2022-08-01 DIAGNOSIS — F900 Attention-deficit hyperactivity disorder, predominantly inattentive type: Secondary | ICD-10-CM | POA: Diagnosis not present

## 2022-08-01 DIAGNOSIS — F411 Generalized anxiety disorder: Secondary | ICD-10-CM | POA: Diagnosis not present

## 2022-08-01 MED ORDER — HYDROXYZINE HCL 25 MG PO TABS
25.0000 mg | ORAL_TABLET | Freq: Every day | ORAL | 0 refills | Status: DC
  Filled 2022-08-01: qty 30, 15d supply, fill #0

## 2022-08-01 MED ORDER — CITALOPRAM HYDROBROMIDE 10 MG PO TABS
10.0000 mg | ORAL_TABLET | Freq: Every day | ORAL | 0 refills | Status: DC
  Filled 2022-08-01: qty 30, 30d supply, fill #0

## 2022-08-03 ENCOUNTER — Other Ambulatory Visit: Payer: 59

## 2022-08-03 ENCOUNTER — Ambulatory Visit: Payer: 59 | Admitting: Cardiovascular Disease

## 2022-08-04 ENCOUNTER — Ambulatory Visit: Payer: 59 | Admitting: Cardiovascular Disease

## 2022-08-14 ENCOUNTER — Other Ambulatory Visit (HOSPITAL_COMMUNITY): Payer: Self-pay

## 2022-08-15 ENCOUNTER — Other Ambulatory Visit (HOSPITAL_COMMUNITY): Payer: Self-pay

## 2022-08-18 ENCOUNTER — Encounter: Payer: Self-pay | Admitting: Nurse Practitioner

## 2022-08-21 ENCOUNTER — Other Ambulatory Visit (HOSPITAL_COMMUNITY): Payer: Self-pay

## 2022-08-22 MED FILL — Ergocalciferol Cap 1.25 MG (50000 Unit): ORAL | 84 days supply | Qty: 12 | Fill #1 | Status: CN

## 2022-08-27 NOTE — Progress Notes (Unsigned)
Orson Eva, NP   No chief complaint on file.   HPI:      [name] is a 37 y.o. [id number] whose LMP was No LMP recorded., presents today for *** Mirnea placed 3/24 for menorrhagia/IDA/leio   Patient Active Problem List   Diagnosis Date Noted   Double vision 07/11/2022   Paroxysmal atrial fibrillation (HCC) 07/11/2022   Hypertensive urgency 07/11/2022   Hyperlipidemia 07/11/2022   Atrial fibrillation, currently in sinus rhythm 04/10/2022   Primary hypertension 04/10/2022   Cervical high risk human papillomavirus (HPV) DNA test positive 03/28/2022   Leiomyoma 03/28/2022   Menorrhagia with regular cycle 03/28/2022   Iron deficiency anemia due to chronic blood loss 03/28/2022   Cephalalgia 03/16/2014    Past Surgical History:  Procedure Laterality Date   HERNIA REPAIR     LITHOTRIPSY     x 2   TUBAL LIGATION      Family History  Problem Relation Age of Onset   Alcohol abuse Mother    Drug abuse Mother    Bipolar disorder Mother    Schizophrenia Father    Alcohol abuse Father    Drug abuse Father    Kidney disease Neg Hx    Bladder Cancer Neg Hx     Social History   Socioeconomic History   Marital status: Married    Spouse name: Not on file   Number of children: Not on file   Years of education: Not on file   Highest education level: Not on file  Occupational History   Not on file  Tobacco Use   Smoking status: Never   Smokeless tobacco: Never  Vaping Use   Vaping status: Never Used  Substance and Sexual Activity   Alcohol use: Yes    Alcohol/week: 2.0 - 3.0 standard drinks of alcohol    Types: 2 - 3 Standard drinks or equivalent per week    Comment: wine, mixed drinks, occasional beer   Drug use: No    Comment: quit using marijuana 3 years ago   Sexual activity: Yes    Birth control/protection: None, Surgical    Comment: Tubal Ligation  Other Topics Concern   Not on file  Social History Narrative   Not on file   Social  Determinants of Health   Financial Resource Strain: Not on file  Food Insecurity: Not on file  Transportation Needs: Not on file  Physical Activity: Not on file  Stress: Not on file  Social Connections: Not on file  Intimate Partner Violence: Not on file    Outpatient Medications Prior to Visit  Medication Sig Dispense Refill   Ascorbic Acid (VITAMIN C) 100 MG tablet Take 100 mg by mouth daily.     citalopram (CELEXA) 10 MG tablet Take 1 tablet (10 mg total) by mouth daily. 30 tablet 0   cloNIDine (CATAPRES) 0.1 MG tablet Take 1 tablet (0.1 mg total) by mouth daily for anxiety/irritability 30 tablet 1   Cyanocobalamin (VITAMIN B-12 PO) Take 1 tablet by mouth daily.     hydrOXYzine (ATARAX) 25 MG tablet Take 1-2 tablets (25-50 mg total) by mouth daily. 30 tablet 0   levonorgestrel (MIRENA, 52 MG,) 20 MCG/DAY IUD 1 each by Intrauterine route once.     lisdexamfetamine (VYVANSE) 20 MG capsule Take 1 capsule (20 mg total) by mouth every morning. 30 capsule 0   lisdexamfetamine (VYVANSE) 20 MG capsule Take 1 capsule (20 mg total) by mouth every morning. 30 capsule 0  losartan (COZAAR) 50 MG tablet Take 1 tablet (50 mg total) by mouth 2 (two) times daily. 180 tablet 1   Magnesium 250 MG TABS Take 1 tablet by mouth every evening.     rosuvastatin (CRESTOR) 10 MG tablet Take 10 mg by mouth every other day.     sotalol (BETAPACE) 160 MG tablet Take 1 tablet (160 mg total) by mouth 2 (two) times daily (take an extra tablet as needed for elevated heart rate) 60 tablet 2   spironolactone (ALDACTONE) 25 MG tablet Take 1 tablet (25 mg) by mouth daily. 30 tablet 11   Vitamin D, Ergocalciferol, (DRISDOL) 1.25 MG (50000 UNIT) CAPS capsule Take 1 capsule by mouth once a week. 32 capsule 0   No facility-administered medications prior to visit.      ROS:  Review of Systems BREAST: No symptoms   OBJECTIVE:   Vitals:  There were no vitals taken for this visit.  Physical Exam  Results: No  results found for this or any previous visit (from the past 24 hour(s)).   Assessment/Plan: No diagnosis found.    No orders of the defined types were placed in this encounter.     No follow-ups on file.  Swayzee Wadley B. Azra Abrell, PA-C 08/27/2022 8:58 PM

## 2022-08-28 ENCOUNTER — Ambulatory Visit (INDEPENDENT_AMBULATORY_CARE_PROVIDER_SITE_OTHER): Payer: 59 | Admitting: Obstetrics and Gynecology

## 2022-08-28 ENCOUNTER — Other Ambulatory Visit (HOSPITAL_COMMUNITY)
Admission: RE | Admit: 2022-08-28 | Discharge: 2022-08-28 | Disposition: A | Discharge status: Home / Self Care | Payer: 59 | Source: Ambulatory Visit | Attending: Obstetrics and Gynecology | Admitting: Obstetrics and Gynecology

## 2022-08-28 ENCOUNTER — Encounter: Payer: Self-pay | Admitting: Obstetrics and Gynecology

## 2022-08-28 VITALS — BP 144/94 | HR 65 | Ht 63.0 in | Wt 221.0 lb

## 2022-08-28 DIAGNOSIS — Z1151 Encounter for screening for human papillomavirus (HPV): Secondary | ICD-10-CM | POA: Insufficient documentation

## 2022-08-28 DIAGNOSIS — R87810 Cervical high risk human papillomavirus (HPV) DNA test positive: Secondary | ICD-10-CM | POA: Insufficient documentation

## 2022-08-28 DIAGNOSIS — Z124 Encounter for screening for malignant neoplasm of cervix: Secondary | ICD-10-CM

## 2022-08-28 DIAGNOSIS — Z30432 Encounter for removal of intrauterine contraceptive device: Secondary | ICD-10-CM | POA: Diagnosis not present

## 2022-08-28 DIAGNOSIS — N920 Excessive and frequent menstruation with regular cycle: Secondary | ICD-10-CM

## 2022-08-28 NOTE — Patient Instructions (Signed)
I value your feedback and you entrusting us with your care. If you get a [facility] patient survey, I would appreciate you taking the time to let us know about your experience today. Thank you! ? ? ?

## 2022-08-29 ENCOUNTER — Other Ambulatory Visit (HOSPITAL_COMMUNITY): Payer: Self-pay

## 2022-09-04 ENCOUNTER — Other Ambulatory Visit (HOSPITAL_COMMUNITY): Payer: Self-pay

## 2022-09-08 ENCOUNTER — Ambulatory Visit (INDEPENDENT_AMBULATORY_CARE_PROVIDER_SITE_OTHER): Payer: 59 | Admitting: Internal Medicine

## 2022-09-08 ENCOUNTER — Other Ambulatory Visit (HOSPITAL_COMMUNITY): Payer: Self-pay

## 2022-09-08 ENCOUNTER — Other Ambulatory Visit: Payer: Self-pay

## 2022-09-08 ENCOUNTER — Encounter: Payer: Self-pay | Admitting: Internal Medicine

## 2022-09-08 VITALS — BP 130/70 | HR 62 | Temp 98.0°F | Ht 63.0 in | Wt 224.0 lb

## 2022-09-08 DIAGNOSIS — I10 Essential (primary) hypertension: Secondary | ICD-10-CM

## 2022-09-08 DIAGNOSIS — F419 Anxiety disorder, unspecified: Secondary | ICD-10-CM

## 2022-09-08 DIAGNOSIS — D500 Iron deficiency anemia secondary to blood loss (chronic): Secondary | ICD-10-CM | POA: Diagnosis not present

## 2022-09-08 DIAGNOSIS — E782 Mixed hyperlipidemia: Secondary | ICD-10-CM | POA: Diagnosis not present

## 2022-09-08 MED ORDER — BUPROPION HCL ER (XL) 150 MG PO TB24
150.0000 mg | ORAL_TABLET | Freq: Every day | ORAL | 1 refills | Status: DC
  Filled 2022-09-08 – 2022-09-09 (×2): qty 90, 90d supply, fill #0

## 2022-09-08 MED ORDER — SOTALOL HCL 160 MG PO TABS
160.0000 mg | ORAL_TABLET | Freq: Two times a day (BID) | ORAL | 3 refills | Status: DC
Start: 2022-09-08 — End: 2023-09-03
  Filled 2022-09-08: qty 180, 90d supply, fill #0
  Filled 2022-09-11 – 2022-10-06 (×2): qty 180, 60d supply, fill #0
  Filled 2023-01-01: qty 180, 60d supply, fill #1

## 2022-09-08 MED ORDER — CLONAZEPAM 0.5 MG PO TABS
0.5000 mg | ORAL_TABLET | Freq: Two times a day (BID) | ORAL | 1 refills | Status: DC | PRN
  Filled 2022-09-08 – 2022-09-09 (×2): qty 30, 15d supply, fill #0
  Filled 2022-11-09: qty 30, 15d supply, fill #1

## 2022-09-08 MED ORDER — LOSARTAN POTASSIUM 100 MG PO TABS
100.0000 mg | ORAL_TABLET | Freq: Every day | ORAL | 3 refills | Status: DC
Start: 2022-09-08 — End: 2022-11-21
  Filled 2022-09-08: qty 90, 90d supply, fill #0

## 2022-09-08 MED ORDER — VITAMIN D (ERGOCALCIFEROL) 1.25 MG (50000 UNIT) PO CAPS
50000.0000 [IU] | ORAL_CAPSULE | ORAL | 0 refills | Status: AC
  Filled 2022-09-08 – 2022-09-09 (×2): qty 12, 84d supply, fill #0

## 2022-09-08 NOTE — Patient Instructions (Signed)
Come back in 3 months and send [place] a message 3-4 weeks to let us know how you are doing.

## 2022-09-08 NOTE — Assessment & Plan Note (Signed)
Will start wellbutrin 150 mg daily to see if this helps with weight and with anxiety. She also needs controller for panic attacks rx clonazepam 0.5 mg prn. She has used this with good relief and rarely in the past. She understands risk of dependence.

## 2022-09-08 NOTE — Assessment & Plan Note (Signed)
Recent levels normal and is not having abnormal menstrual bleeding currently.

## 2022-09-08 NOTE — Assessment & Plan Note (Signed)
BP is normal today on losartan (she is prescribed 50 mg BID but taking 100 mg daily) new rx done 100 mg daily for less pill burden and no indication to take this medication BID. Keep losartan 100 mg daily and sotalol 160 mg BID. She has recent labs up to date no labs today. I am concerned she may have been labeled A fib in error as I can find no supporting documentation or EKG with A fib. This is a serious diagnosis to add. I will obtain records from her prior cardiologist and she has a new cardiologist upcoming. She also states she was told about findings on echo and I cannot find supporting documentation on that either and will try to obtain those records.

## 2022-09-08 NOTE — Progress Notes (Signed)
   Subjective:   Patient ID: Jasmine Barnes, female    DOB: [birth year], 37 y.o.   MRN: [id number]  HPI The patient is a new 37 YO female coming in for ongoing care. She has changed medications on her own and is doing much better. BP under control. Anxiety not under control and has nothing for this. She is concerned about recent diagnosis of A fib however I cannot find any documentation of A fib. Will get records from her prior cardiologist. This is a serious diagnosis and if not true should not be in her medical record.   PMH, [facility], social history reviewed and updated  Review of Systems  Constitutional: Negative.   HENT: Negative.    Eyes: Negative.   Respiratory:  Negative for cough, chest tightness and shortness of breath.   Cardiovascular:  Positive for palpitations. Negative for chest pain and leg swelling.  Gastrointestinal:  Negative for abdominal distention, abdominal pain, constipation, diarrhea, nausea and vomiting.  Musculoskeletal: Negative.   Skin: Negative.   Neurological: Negative.   Psychiatric/Behavioral:  The patient is nervous/anxious.     Objective:  Physical Exam Constitutional:      Appearance: She is well-developed.  HENT:     Head: Normocephalic and atraumatic.  Cardiovascular:     Rate and Rhythm: Normal rate and regular rhythm.  Pulmonary:     Effort: Pulmonary effort is normal. No respiratory distress.     Breath sounds: Normal breath sounds. No wheezing or rales.  Abdominal:     General: Bowel sounds are normal. There is no distension.     Palpations: Abdomen is soft.     Tenderness: There is no abdominal tenderness. There is no rebound.  Musculoskeletal:     Cervical back: Normal range of motion.  Skin:    General: Skin is warm and dry.  Neurological:     Mental Status: She is alert and oriented to person, place, and time.     Coordination: Coordination normal.     Vitals:   09/08/22 1205  BP: 130/70  Pulse: 62  Temp: 98 F (36.7 C)   TempSrc: Oral  SpO2: 98%  Weight: 224 lb (101.6 kg)  Height: 5\' 3"  (1.6 m)    Assessment & Plan:  Visit time 35 minutes in face to face communication with patient and coordination of care, additional 15 minutes spent in record review, coordination or care, ordering tests, communicating/referring to other healthcare professionals, documenting in medical records all on the same day of the visit for total time 50 minutes spent on the visit.

## 2022-09-08 NOTE — Assessment & Plan Note (Signed)
Will check with upcoming labs.

## 2022-09-09 ENCOUNTER — Other Ambulatory Visit (HOSPITAL_COMMUNITY): Payer: Self-pay

## 2022-09-11 ENCOUNTER — Other Ambulatory Visit (HOSPITAL_COMMUNITY): Payer: Self-pay

## 2022-09-11 ENCOUNTER — Encounter: Payer: Self-pay | Admitting: Internal Medicine

## 2022-09-12 ENCOUNTER — Other Ambulatory Visit (HOSPITAL_COMMUNITY): Payer: Self-pay

## 2022-09-12 ENCOUNTER — Other Ambulatory Visit: Payer: Self-pay

## 2022-09-19 ENCOUNTER — Encounter: Payer: Self-pay | Admitting: Internal Medicine

## 2022-09-22 ENCOUNTER — Other Ambulatory Visit (HOSPITAL_COMMUNITY): Payer: Self-pay

## 2022-09-26 ENCOUNTER — Encounter: Payer: Self-pay | Admitting: Internal Medicine

## 2022-09-27 ENCOUNTER — Other Ambulatory Visit (HOSPITAL_COMMUNITY): Payer: Self-pay

## 2022-09-27 ENCOUNTER — Encounter: Payer: Self-pay | Admitting: Internal Medicine

## 2022-09-27 MED ORDER — AMLODIPINE BESYLATE 5 MG PO TABS
5.0000 mg | ORAL_TABLET | Freq: Every day | ORAL | 0 refills | Status: DC
  Filled 2022-09-27: qty 90, 90d supply, fill #0

## 2022-10-02 NOTE — Progress Notes (Signed)
BP check  

## 2022-10-06 ENCOUNTER — Other Ambulatory Visit (HOSPITAL_BASED_OUTPATIENT_CLINIC_OR_DEPARTMENT_OTHER): Payer: Self-pay

## 2022-10-06 ENCOUNTER — Other Ambulatory Visit (HOSPITAL_COMMUNITY): Payer: Self-pay

## 2022-10-09 ENCOUNTER — Ambulatory Visit (INDEPENDENT_AMBULATORY_CARE_PROVIDER_SITE_OTHER): Payer: 59 | Admitting: Internal Medicine

## 2022-10-09 ENCOUNTER — Encounter: Payer: Self-pay | Admitting: Internal Medicine

## 2022-10-09 VITALS — BP 126/88 | HR 78 | Temp 98.1°F | Ht 63.0 in | Wt 223.0 lb

## 2022-10-09 DIAGNOSIS — Z6839 Body mass index (BMI) 39.0-39.9, adult: Secondary | ICD-10-CM | POA: Diagnosis not present

## 2022-10-09 DIAGNOSIS — I10 Essential (primary) hypertension: Secondary | ICD-10-CM

## 2022-10-09 NOTE — Patient Instructions (Signed)
If the cardiologist thinks you are fine we can try topiramate

## 2022-10-09 NOTE — Progress Notes (Unsigned)
Subjective:   Patient ID: Jasmine Barnes, female    DOB: [birth year], 37 y.o.   MRN: [id number]  HPI The patient is a 37 YO female coming in for heartburn and wanting to talk about weight management.   Review of Systems  Constitutional: Negative.   HENT: Negative.    Eyes: Negative.   Respiratory:  Negative for cough, chest tightness and shortness of breath.   Cardiovascular:  Negative for chest pain, palpitations and leg swelling.  Gastrointestinal:  Negative for abdominal distention, abdominal pain, constipation, diarrhea, nausea and vomiting.       GERD  Musculoskeletal: Negative.   Skin: Negative.   Neurological: Negative.   Psychiatric/Behavioral: Negative.      Objective:  Physical Exam Constitutional:      Appearance: She is well-developed.  HENT:     Head: Normocephalic and atraumatic.  Cardiovascular:     Rate and Rhythm: Normal rate and regular rhythm.  Pulmonary:     Effort: Pulmonary effort is normal. No respiratory distress.     Breath sounds: Normal breath sounds. No wheezing or rales.  Abdominal:     General: Bowel sounds are normal. There is no distension.     Palpations: Abdomen is soft.     Tenderness: There is no abdominal tenderness. There is no rebound.  Musculoskeletal:     Cervical back: Normal range of motion.  Skin:    General: Skin is warm and dry.  Neurological:     Mental Status: She is alert and oriented to person, place, and time.     Coordination: Coordination normal.     Vitals:   10/09/22 1442  BP: 126/88  Pulse: 78  Temp: 98.1 F (36.7 C)  TempSrc: Oral  SpO2: 99%  Weight: 223 lb (101.2 kg)  Height: 5\' 3"  (1.6 m)    Assessment & Plan:

## 2022-10-11 NOTE — Assessment & Plan Note (Signed)
Still having some faster heart rate and BP is a little variable. She is seeing cardiology soon. Have requested they weigh in if she could start topiramate for weight loss.

## 2022-10-11 NOTE — Assessment & Plan Note (Signed)
BMI 39 complicated by hyperlipidemia and hypertension. We will have her ask cardiology if they have any concerns about her starting topiramate for weight loss. We will avoid and stimulant based options. GLP-1 is not a covered option for her at this time but would be a good option.

## 2022-10-19 ENCOUNTER — Ambulatory Visit: Payer: 59 | Attending: Internal Medicine | Admitting: Internal Medicine

## 2022-10-19 ENCOUNTER — Encounter: Payer: Self-pay | Admitting: Internal Medicine

## 2022-10-19 VITALS — BP 132/98 | HR 58 | Ht 63.0 in | Wt 222.2 lb

## 2022-10-19 DIAGNOSIS — G4719 Other hypersomnia: Secondary | ICD-10-CM | POA: Diagnosis not present

## 2022-10-19 DIAGNOSIS — R0602 Shortness of breath: Secondary | ICD-10-CM | POA: Diagnosis not present

## 2022-10-19 DIAGNOSIS — R42 Dizziness and giddiness: Secondary | ICD-10-CM

## 2022-10-19 DIAGNOSIS — R0989 Other specified symptoms and signs involving the circulatory and respiratory systems: Secondary | ICD-10-CM

## 2022-10-19 DIAGNOSIS — R002 Palpitations: Secondary | ICD-10-CM

## 2022-10-19 DIAGNOSIS — R072 Precordial pain: Secondary | ICD-10-CM

## 2022-10-19 DIAGNOSIS — Z01812 Encounter for preprocedural laboratory examination: Secondary | ICD-10-CM | POA: Diagnosis not present

## 2022-10-19 NOTE — Patient Instructions (Addendum)
Medication Instructions:  NO CHANGES  *If you need a refill on your cardiac medications before your next appointment, please call your pharmacy*   Lab Work: BMET today   If you have labs (blood work) drawn today and your tests are completely normal, you will receive your results only by: MyChart Message (if you have MyChart) OR A paper copy in the mail If you have any lab test that is abnormal or we need to change your treatment, we will call you to review the results.   Testing/Procedures: Your physician has requested that you have an echocardiogram. Echocardiography is a painless test that uses sound waves to create images of your heart. It provides your doctor with information about the size and shape of your heart and how well your heart's chambers and valves are working. This procedure takes approximately one hour. There are no restrictions for this procedure. Please do NOT wear cologne, perfume, aftershave, or lotions (deodorant is allowed). Please arrive 15 minutes prior to your appointment time.  WatchPAT?  Is a FDA cleared portable home sleep study test that uses a watch and 3 points of contact to monitor 7 different channels, including your heart rate, oxygen saturations, body position, snoring, and chest motion.  The study is easy to use from the comfort of your own home and accurately detect sleep apnea.  Before bed, you attach the chest sensor, attached the sleep apnea bracelet to your nondominant hand, and attach the finger probe.  After the study, the raw data is downloaded from the watch and scored for apnea events.   For more information: [email or web address]  Patient Testing Instructions:  Do not put battery into the device until bedtime when you are ready to begin the test. Please call the support number if you need assistance after following the instructions below: 24 hour support line- [phone] or [facility] support at [phone] (option 2)   Download the IntelWatchPAT One" app through the google play store or App Store  Be sure to turn on or enable access to bluetooth in settlings on your smartphone/ device  Make sure no other bluetooth devices are on and within the vicinity of your smartphone/ device and WatchPAT watch during testing.  Make sure to leave your smart phone/ device plugged in and charging all night.  When ready for bed:  Follow the instructions step by step in the WatchPAT One App to activate the testing device. For additional instructions, including video instruction, visit the WatchPAT One video on Youtube. You can search for WatchPat One within Youtube (video is 4 minutes and 18 seconds) or enter: https://youtube/[email or web address]?[email or web address]_vbiwxE Please note: You will be prompted to enter a Pin to connect via bluetooth when starting the test. The PIN will be assigned to you when you receive the test.  The device is disposable, but it recommended that you retain the device until you receive a call letting you know the study has been received and the results have been interpreted.  We will let you know if the study did not transmit to [place] properly after the test is completed. You do not need to call us to confirm the receipt of the test.  Please complete the test within 48 hours of receiving PIN.   Frequently Asked Questions:  What is Watch Dennie Bible one?  A single use fully disposable home sleep apnea testing device and will not need to be returned after completion.  What are the requirements to use WatchPAT one?  The be able to have  a successful watchpat one sleep study, you should have your Watch pat one device, your smart phone, watch pat one app, your PIN number and Internet access What type of phone do I need?  You should have a smart phone that uses Android 5.1 and above or any Iphone with IOS 10 and above How can I download the WatchPAT one app?  Based on your device type search for WatchPAT one app either in google play for  android devices or APP store for Iphone's Where will I get my PIN for the study?  Your PIN will be provided by your physician's office. It is used for authentication and if you lose/forget your PIN, please reach out to your providers office.  I do not have Internet at home. Can I do WatchPAT one study?  WatchPAT One needs Internet connection throughout the night to be able to transmit the sleep data. You can use your home/local internet or your cellular's data package. However, it is always recommended to use home/local Internet. It is estimated that between 20MB-30MB will be used with each study.However, the application will be looking for space in the phone to start the study.  What happens if I lose internet or bluetooth connection?  During the internet disconnection, your phone will not be able to transmit the sleep data. All the data, will be stored in your phone. As soon as the internet connection is back on, the phone will being sending the sleep data. During the bluetooth disconnection, WatchPAT one will not be able to to send the sleep data to your phone. Data will be kept in the [facility] one until two devices have bluetooth connection back on. As soon as the connection is back on, WatchPAT one will send the sleep data to the phone.  How long do I need to wear the WatchPAT one?  After you start the study, you should wear the device at least 6 hours.  How far should I keep my phone from the device?  During the night, your phone should be within 15 feet.  What happens if I leave the room for restroom or other reasons?  Leaving the room for any reason will not cause any problem. As soon as your get back to the room, both devices will reconnect and will continue to send the sleep data. Can I use my phone during the sleep study?  Yes, you can use your phone as usual during the study. But it is recommended to put your watchpat one on when you are ready to go to bed.  How will I get my study  results?  A soon as you completed your study, your sleep data will be sent to the provider. They will then share the results with you when they are ready.    Coronary CTA at [facility]. You'll get a call to schedule this once approved with insurance.    Follow-Up: At [facility], you and your health needs are our priority.  As part of our continuing mission to provide you with exceptional heart care, we have created designated Provider Care Teams.  These Care Teams include your primary Cardiologist (physician) and Advanced Practice Providers (APPs -  Physician Assistants and Nurse Practitioners) who all work together to provide you with the care you need, when you need it.  We recommend signing up for the patient portal called "MyChart".  Sign up information is provided on this After Visit Summary.  MyChart is used to connect with  patients for Virtual Visits (Telemedicine).  Patients are able to view lab/test results, encounter notes, upcoming appointments, etc.  Non-urgent messages can be sent to your provider as well.   To learn more about what you can do with MyChart, go to [email or web address].    Your next appointment:    6-8 weeks with Dr. Rennis Golden   Other Instructions   Your cardiac CT will be scheduled at one of the below locations:   [facility] [address] [place], [place] [address] [phone]  OR  [facility] [address] [place], [place] [address] [phone]  OR   [facility] [address] [place], [place] [address] [phone]  If scheduled at [facility], please arrive at the [facility] and Children's Entrance (Entrance C2) of [facility] 30 minutes prior to test start time. You can use the FREE valet parking offered at entrance C (encouraged to control the heart rate for the test)  Proceed to the [facility] Radiology Department (first  floor) to check-in and test prep.  All radiology patients and guests should use entrance C2 at [facility], accessed from [facility], even though the hospital's physical address listed is [address]    If scheduled at [facility] or [facility], please arrive 15 mins early for check-in and test prep.  There is spacious parking and easy access to the radiology department from the [facility] entrance. Please enter here and check-in with the desk attendant.   Please follow these instructions carefully (unless otherwise directed):  An IV will be required for this test and Nitroglycerin will be given.  Hold all erectile dysfunction medications at least 3 days (72 hrs) prior to test. (Ie viagra, cialis, sildenafil, tadalafil, etc)   On the Night Before the Test: Be sure to Drink plenty of water. Do not consume any caffeinated/decaffeinated beverages or chocolate 12 hours prior to your test. Do not take any antihistamines 12 hours prior to your test.   On the Day of the Test: Drink plenty of water until 1 hour prior to the test. Do not eat any food 1 hour prior to test. You may take your regular medications prior to the test.  Make sure to take Sotalol the day of test If you take Furosemide/Hydrochlorothiazide/Spironolactone, please HOLD on the morning of the test. FEMALES- please wear underwire-free bra if available, avoid dresses & tight clothing       After the Test: Drink plenty of water. After receiving IV contrast, you may experience a mild flushed feeling. This is normal. On occasion, you may experience a mild rash up to 24 hours after the test. This is not dangerous. If this occurs, you can take Benadryl 25 mg and increase your fluid intake. If you experience trouble breathing, this can be serious. If it is severe call 911 IMMEDIATELY. If it is mild, please call our office. If you take  any of these medications: Glipizide/Metformin, Avandament, Glucavance, please do not take 48 hours after completing test unless otherwise instructed.  We will call to schedule your test 2-4 weeks out understanding that some insurance companies will need an authorization prior to the service being performed.   For more information and frequently asked questions, please visit our website : [email or web address]  For non-scheduling related questions, please contact the cardiac imaging nurse navigator should you have any questions/concerns: Cardiac Imaging Nurse Navigators  Direct Office Dial: [phone]   For scheduling needs, including cancellations and rescheduling, please call [place], [phone].

## 2022-10-19 NOTE — Progress Notes (Signed)
OFFICE CONSULT NOTE  Chief Complaint:  Tachycardia, labile hypertension, shortness of breath, chest pain   Primary Care Physician: Myrlene Broker, MD  HPI:  Jasmine Barnes is a 37 y.o. female who is being seen today for the evaluation of multiple cardiac symptoms as a second opinion. 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  She reports longstanding history of hypertension has been under the care of Dr. Park Breed in [facility] with cardiology.  More recently she has had significant symptomatic palpitations and tachycardia.  In January she wore a monitor and she brought those results today.  This showed a regular narrow complex tachycardia with defined P waves that appear to be a sinus tachycardia although she reported no activities or anything that might explain that suggesting perhaps inappropriate sinus tachycardia.  It does not appear to be atrial flutter although I could not rule out an atrial tachycardia.  There was no clear evidence of atrial fibrillation.  She says she has undergone extensive testing in the past including echo and other ischemic evaluations which have been unremarkable although she reported her sister has A-fib and other family members have heart disease.  Other medical problems on the chart include bipolar disorder although she denied that and reported only anxiety, PCOS, prediabetes, hypertension and questionable atrial fibrillation.  Due to poor control of her arrhythmias with metoprolol she was ultimately switched to sotalol.  With this generally she gets good control although has to take additional medication from time to time to control heart rate.  Blood pressure continues to spike at times and she said she recently "fell out at work".  The last time she was seen in the ER was in June 2024 at the time she had double vision and felt confused and had a headache and she was found to have blood pressure 220/143.  She was  treated for hypertensive emergency.  She did undergo CT head and neck as well as CT angiogram and MRI of the brain, all of which were unremarkable.  She is recently established with Dr. Okey Dupre as her primary care provider.  She has been seen numerous times recently for blood pressure management.  She is also inquired about weight loss however this is not a [facility] covered option for her and there was discussion about topiramate.  She does also report a history of mitral valve prolapse in the past.  There also is possible concern for obstructive sleep apnea given the fact that she has some daytime fatigue, poor sleep and snoring.  She often wakes up with some tachycardia.  She does work second shift but is transferring to daytime later this month.  PMHx:  Past Medical History:  Diagnosis Date   Anxiety    Atrial fibrillation, currently in sinus rhythm    Bipolar disorder (HCC)    Depression    Kidney stone    Leiomyoma 2021   suserosal on u/s   Mixed hyperlipidemia    PCOS (polycystic ovarian syndrome) 2021   Dr. Bonney Aid   Pre-diabetes    Primary hypertension     Past Surgical History:  Procedure Laterality Date   HERNIA REPAIR     LITHOTRIPSY     x 2   TUBAL LIGATION      FAMHx:  Family History  Problem Relation Age of Onset   Alcohol abuse Mother    Drug abuse Mother    Bipolar disorder Mother    Schizophrenia Father  Alcohol abuse Father    Drug abuse Father    Kidney disease Neg Hx    Bladder Cancer Neg Hx     SOCHx:   reports that she has never smoked. She has never used smokeless tobacco. She reports current alcohol use of about 2.0 - 3.0 standard drinks of alcohol per week. She reports that she does not use drugs.  ALLERGIES:  Allergies  Allergen Reactions   Penicillin G Anaphylaxis   Penicillins     ROS: Pertinent items noted in HPI and remainder of comprehensive ROS otherwise negative.  HOME MEDS: Current Outpatient Medications on File Prior to Visit   Medication Sig Dispense Refill   amLODipine (NORVASC) 5 MG tablet Take 1 tablet (5 mg total) by mouth daily. 90 tablet 0   Ascorbic Acid (VITAMIN C) 100 MG tablet Take 100 mg by mouth daily.     buPROPion (WELLBUTRIN XL) 150 MG 24 hr tablet Take 1 tablet (150 mg total) by mouth daily. 90 tablet 1   clonazePAM (KLONOPIN) 0.5 MG tablet Take 1 tablet (0.5 mg total) by mouth 2 (two) times daily as needed for anxiety. 30 tablet 1   Cyanocobalamin (VITAMIN B-12 PO) Take 1 tablet by mouth daily.     losartan (COZAAR) 100 MG tablet Take 1 tablet (100 mg total) by mouth daily. 90 tablet 3   Magnesium 250 MG TABS Take 1 tablet by mouth every evening.     sotalol (BETAPACE) 160 MG tablet Take 1 tablet (160 mg total) by mouth 2 (two) times daily (take an extra tablet as needed for elevated heart rate) 180 tablet 3   Vitamin D, Ergocalciferol, (DRISDOL) 1.25 MG (50000 UNIT) CAPS capsule Take 1 capsule (50,000 Units total) by mouth every 7 (seven) days. 12 capsule 0   No current facility-administered medications on file prior to visit.    LABS/IMAGING: No results found for this or any previous visit (from the past 48 hour(s)). No results found.  LIPID PANEL: No results found for: "CHOL", "TRIG", "HDL", "CHOLHDL", "VLDL", "LDLCALC", "LDLDIRECT"  WEIGHTS: Wt Readings from Last 3 Encounters:  10/19/22 222 lb 3.2 oz (100.8 kg)  10/09/22 223 lb (101.2 kg)  09/08/22 224 lb (101.6 kg)    VITALS: BP (!) 132/98   Pulse (!) 58   Ht 5\' 3"  (1.6 m)   Wt 222 lb 3.2 oz (100.8 kg)   LMP 09/25/2022   SpO2 99%   BMI 39.36 kg/m   EXAM: General appearance: alert, no distress, and morbidly obese Neck: no carotid bruit, no JVD, and thyroid not enlarged, symmetric, no tenderness/mass/nodules Lungs: clear to auscultation bilaterally Heart: regular rate and rhythm, no S3 or S4, and systolic murmur: early systolic 2/6, blowing at apex Abdomen: soft, non-tender; bowel sounds normal; no masses,  no  organomegaly Extremities: extremities normal, atraumatic, no cyanosis or edema Pulses: 2+ and symmetric Skin: Skin color, texture, turgor normal. No rashes or lesions Neurologic: Grossly normal Psych: Pleasant  EKG: EKG Interpretation Date/Time:  Thursday October 19 2022 13:08:50 EDT Ventricular Rate:  63 PR Interval:  124 QRS Duration:  100 QT Interval:  438 QTC Calculation: 448 R Axis:   9  Text Interpretation: Normal sinus rhythm Normal ECG When compared with ECG of 10-Jul-2022 18:47, No significant change since last tracing Confirmed by Zoila Shutter [phone]) on 10/19/2022 1:46:44 PM    ASSESSMENT: Palpitations/tachycardia, possible history of atrial fibrillation on sotalol Chest pain, dyspnea, fatigue Labile hypertension with visual changes History of mitral valve prolapse Fatigue,  poor sleep and snoring-at risk for obstructive sleep apnea, STOP-BANG score of 4  PLAN: 1.   Jasmine Barnes has been having quite a few concerning issues recently including labile hypertension, inappropriate tachycardia, questionable atrial fibrillation (not anticoagulated by her choice), chest pain and fatigue, visual changes and other symptoms.  She does report a history of mitral valve prolapse.  I will request old records but plan to repeat an echocardiogram.  In addition, we will proceed with CT coronary angiography.  Although she has paroxysmal arrhythmias, on the sotalol in general they have been better controlled.  Heart rate is in the 50s today.  I suspect if she takes her sotalol she might need some IV metoprolol but will not likely need additional rate lowering agents for her CT.  There is a concern for possible obstructive sleep apnea given weight, fatigue, poor sleep and snoring.  Will arrange for home sleep study.  Plan follow-up after the studies.  Would continue her current medications.  I would not advise starting topiramate as it is minimally effective at low and could potentially worsen some  of her anxiety/palpitations.  Thanks for the kind referral. Follow-up with me after to review her test results.  Chrystie Nose, MD, [facility], FACP  [place]  [facility]  Medical Director of the Advanced Lipid Disorders &  Cardiovascular Risk Reduction Clinic Diplomate of the American Board of Clinical Lipidology Attending Cardiologist  Direct Dial: [phone]  Fax: [phone]  Website:  [email or web address] Nicely Sherril Heyward 10/19/2022, 1:47 PM

## 2022-10-19 NOTE — Progress Notes (Signed)
Patient agreement reviewed and signed on 10/19/2022.  WatchPAT issued to patient on 10/19/2022 by Donneta Romberg, CMA. Patient aware to not open the WatchPAT box until contacted with the activation PIN. Patient profile initialized in CloudPAT on 10/19/2022 by Myna Hidalgo. Device serial number: [id number]  Please list Reason for Call as Advice Only and type "WatchPAT issued to patient" in the comment box.

## 2022-10-20 LAB — BASIC METABOLIC PANEL
BUN/Creatinine Ratio: 19 (ref 9–23)
BUN: 14 mg/dL (ref 6–20)
CO2: 22 mmol/L (ref 20–29)
Calcium: 9.7 mg/dL (ref 8.7–10.2)
Chloride: 105 mmol/L (ref 96–106)
Creatinine, Ser: 0.72 mg/dL (ref 0.57–1.00)
Glucose: 84 mg/dL (ref 70–99)
Potassium: 4.5 mmol/L (ref 3.5–5.2)
Sodium: 142 mmol/L (ref 134–144)
eGFR: 110 mL/min/{1.73_m2} (ref >59)

## 2022-10-23 ENCOUNTER — Encounter: Payer: Self-pay | Admitting: Internal Medicine

## 2022-10-23 ENCOUNTER — Telehealth: Payer: Self-pay

## 2022-10-23 NOTE — Telephone Encounter (Signed)
**Note De-Identified Idalia Allbritton Obfuscation** Ordering provider: Dr Rennis Golden Associated diagnoses: Excessive daytime sleepiness-G47.19, Snoring-R06.83 and Fatigue-R53.83 WatchPAT PA obtained on 10/23/2022 by Britanny Marksberry, Lorelle Formosa, LPN. No PA Required per Medicare and [employer] Patient notified of PIN (1234) on 10/23/2022 Caymen Dubray Notification Method: phone. I did request that she do her HST within 2 weeks, if possible and she stated that she plans to it on Sunday night.  Phone note routed to covering staff for follow-up.

## 2022-10-31 ENCOUNTER — Encounter (HOSPITAL_COMMUNITY): Payer: Self-pay

## 2022-10-31 ENCOUNTER — Telehealth (HOSPITAL_COMMUNITY): Payer: Self-pay | Admitting: Emergency Medicine

## 2022-10-31 NOTE — Telephone Encounter (Signed)
Reaching out to patient to offer assistance regarding upcoming cardiac imaging study; pt verbalizes understanding of appt date/time, parking situation and where to check in, pre-test NPO status and medications ordered, and verified current allergies; name and call back number provided for further questions should they arise Cayne Yom RN Navigator Cardiac Imaging [place] [facility] [phone] office [phone] cell 

## 2022-11-01 ENCOUNTER — Encounter (INDEPENDENT_AMBULATORY_CARE_PROVIDER_SITE_OTHER): Payer: Self-pay | Admitting: Cardiology

## 2022-11-01 DIAGNOSIS — G4733 Obstructive sleep apnea (adult) (pediatric): Secondary | ICD-10-CM | POA: Diagnosis not present

## 2022-11-01 DIAGNOSIS — G4719 Other hypersomnia: Secondary | ICD-10-CM

## 2022-11-02 ENCOUNTER — Encounter: Payer: Self-pay | Admitting: Obstetrics and Gynecology

## 2022-11-02 ENCOUNTER — Ambulatory Visit
Admission: RE | Admit: 2022-11-02 | Discharge: 2022-11-02 | Disposition: A | Discharge status: Home / Self Care | Payer: 59 | Source: Ambulatory Visit | Attending: Internal Medicine | Admitting: Internal Medicine

## 2022-11-02 DIAGNOSIS — R002 Palpitations: Secondary | ICD-10-CM | POA: Diagnosis present

## 2022-11-02 DIAGNOSIS — R072 Precordial pain: Secondary | ICD-10-CM | POA: Diagnosis present

## 2022-11-02 DIAGNOSIS — R42 Dizziness and giddiness: Secondary | ICD-10-CM | POA: Diagnosis present

## 2022-11-02 DIAGNOSIS — R0602 Shortness of breath: Secondary | ICD-10-CM | POA: Diagnosis present

## 2022-11-02 MED ORDER — METOPROLOL TARTRATE 5 MG/5ML IV SOLN
10.0000 mg | Freq: Once | INTRAVENOUS | Status: AC
  Administered 2022-11-02: 10 mg via INTRAVENOUS

## 2022-11-02 MED ORDER — IOHEXOL 350 MG/ML SOLN
100.0000 mL | Freq: Once | INTRAVENOUS | Status: AC | PRN
  Administered 2022-11-02: 100 mL via INTRAVENOUS

## 2022-11-02 MED ORDER — SODIUM CHLORIDE 0.9 % IV BOLUS
125.0000 mL | Freq: Once | INTRAVENOUS | Status: AC
  Administered 2022-11-02: 125 mL via INTRAVENOUS

## 2022-11-02 MED ORDER — NITROGLYCERIN 0.4 MG SL SUBL
0.8000 mg | SUBLINGUAL_TABLET | Freq: Once | SUBLINGUAL | Status: AC
  Administered 2022-11-02: 0.8 mg via SUBLINGUAL

## 2022-11-02 NOTE — Progress Notes (Signed)
Pt tolerated procedure well with no issues. Pt ABCs intact. Pt denies any complaints. Pt encouraged to drink plenty of water throughout the day. Pt ambulatory with steady gait.

## 2022-11-03 ENCOUNTER — Other Ambulatory Visit (HOSPITAL_COMMUNITY): Payer: Self-pay

## 2022-11-03 MED ORDER — NORETHINDRONE 0.35 MG PO TABS
1.0000 | ORAL_TABLET | Freq: Every day | ORAL | 0 refills | Status: AC
Start: 2022-11-03 — End: ?
  Filled 2022-11-03: qty 84, 84d supply, fill #0

## 2022-11-04 ENCOUNTER — Other Ambulatory Visit (HOSPITAL_COMMUNITY): Payer: Self-pay

## 2022-11-07 NOTE — Progress Notes (Signed)
Order(s) created erroneously. Erroneous order ID: [id number]  Order moved by: Ian Malkin  Order move date/time: 11/07/2022 9:13 AM  Source Patient: [id number]  Source Contact: 10/19/2022  Destination Patient: [id number]  Destination Contact: 06/28/2022

## 2022-11-07 NOTE — Telephone Encounter (Signed)
Patient is following up requesting to review sleep study results again.

## 2022-11-07 NOTE — Telephone Encounter (Signed)
Message sent to sleep coordinator  

## 2022-11-08 ENCOUNTER — Telehealth: Payer: Self-pay | Admitting: Internal Medicine

## 2022-11-08 NOTE — Telephone Encounter (Signed)
Patient called again to follow-up on results of sleep study test and next steps.

## 2022-11-10 ENCOUNTER — Other Ambulatory Visit: Payer: Self-pay

## 2022-11-12 ENCOUNTER — Ambulatory Visit: Payer: 59 | Attending: Internal Medicine

## 2022-11-12 DIAGNOSIS — R42 Dizziness and giddiness: Secondary | ICD-10-CM

## 2022-11-12 DIAGNOSIS — G4719 Other hypersomnia: Secondary | ICD-10-CM

## 2022-11-12 DIAGNOSIS — R0602 Shortness of breath: Secondary | ICD-10-CM

## 2022-11-12 NOTE — Procedures (Addendum)
   SLEEP STUDY REPORT Patient Information Study Date: 11/01/2022 Patient Name: Jasmine Barnes Patient ID: [id number] Birth Date: 1985/06/20 Age: 37 Gender: Female BMI: 39.5 (W=222 lb, H=5' 3'') Referring Physician: K. [place] Hilty, MD  TEST DESCRIPTION: Home sleep apnea testing was completed using the WatchPat, a Type 1 device, utilizing peripheral arterial tonometry (PAT), chest movement, actigraphy, pulse oximetry, pulse rate, body position and snore. AHI was calculated with apnea and hypopnea using valid sleep time as the denominator. RDI includes apneas, hypopneas, and RERAs. The data acquired and the scoring of sleep and all associated events were performed in accordance with the recommended standards and specifications as outlined in the AASM Manual for the Scoring of Sleep and Associated Events 2.2.0 (2015).  FINDINGS:  1. Mild Obstructive Sleep Apnea with AHI 5.1/hr.  2. Central Sleep Apnea with pAHIc 0.5/hr.  3. Oxygen desaturations as low as 89%.  4. Mild to moderate snoring was present. O2 sats were < 88% for 0 min.  5. Total sleep time was 7 hrs and 38 min.  6. 27.6% of total sleep time was spent in REM sleep.  7. Normal sleep onset latency at 16 min.  8. Prolonged REM sleep onset latency at 117 min.  9. Total awakenings were 9 . 10. Arrhythmia detection: None  DIAGNOSIS: Mild Obstructive Sleep Apnea (G47.33)  RECOMMENDATIONS: 1. Clinical correlation of these findings is necessary. The decision to treat obstructive sleep apnea (OSA) is usually based on the presence of apnea symptoms or the presence of associated medical conditions such as Hypertension, Congestive Heart Failure, Atrial Fibrillation or Obesity. The most common symptoms of OSA are snoring, gasping for breath while sleeping, daytime sleepiness and fatigue. 2. Initiating apnea therapy is recommended given the presence of symptoms and/or associated conditions. Recommend proceeding with one of the  following:  a. Auto-CPAP therapy with a pressure range of 5-20cm H2O.  b. An oral appliance (OA) that can be obtained from certain dentists with expertise in sleep medicine. These are primarily of use in non-obese patients with mild and moderate disease.  c. An ENT consultation which may be useful to look for specific causes of obstruction and possible treatment options.  d. If patient is intolerant to PAP therapy, consider referral to ENT for evaluation for hypoglossal nerve stimulator. 3. Close follow-up is necessary to ensure success with CPAP or oral appliance therapy for maximum benefit . 4. A follow-up oximetry study on CPAP is recommended to assess the adequacy of therapy and determine the need for supplemental oxygen or the potential need for Bi-level therapy. An arterial blood gas to determine the adequacy of baseline ventilation and oxygenation should also be considered. 5. Healthy sleep recommendations include: adequate nightly sleep (normal 7-9 hrs/night), avoidance of caffeine after noon and alcohol near bedtime, and maintaining a sleep environment that is cool, dark and quiet. 6. Weight loss for overweight patients is recommended. Even modest amounts of weight loss can significantly improve the severity of sleep apnea. 7. Snoring recommendations include: weight loss where appropriate, side sleeping, and avoidance of alcohol before bed. 8. Operation of motor vehicle should be avoided when sleepy.  Signature: Armanda Magic, MD; [facility]; Diplomat, American Board of Sleep Medicine Electronically Signed: 11/12/2022 9:11:29 PM

## 2022-11-14 ENCOUNTER — Telehealth: Payer: Self-pay

## 2022-11-14 ENCOUNTER — Encounter: Payer: Self-pay | Admitting: Internal Medicine

## 2022-11-14 NOTE — Telephone Encounter (Signed)
-----   Message from Armanda Magic sent at 11/12/2022  9:14 PM EDT ----- Disregard last message - patient has minimal OSA that occurs only in supine position.  NO treatment needed except to avoid sleeping supine

## 2022-11-14 NOTE — Telephone Encounter (Signed)
Notified patient via VM, per DPR, of sleep study results and recommendations. Left call back number for any questions or concerns.

## 2022-11-15 ENCOUNTER — Other Ambulatory Visit (HOSPITAL_COMMUNITY): Payer: Self-pay

## 2022-11-20 ENCOUNTER — Ambulatory Visit (HOSPITAL_COMMUNITY): Payer: 59 | Attending: Cardiovascular Disease

## 2022-11-20 DIAGNOSIS — R072 Precordial pain: Secondary | ICD-10-CM | POA: Diagnosis not present

## 2022-11-20 DIAGNOSIS — R42 Dizziness and giddiness: Secondary | ICD-10-CM

## 2022-11-20 DIAGNOSIS — R002 Palpitations: Secondary | ICD-10-CM | POA: Diagnosis not present

## 2022-11-20 DIAGNOSIS — R0602 Shortness of breath: Secondary | ICD-10-CM

## 2022-11-20 LAB — ECHOCARDIOGRAM COMPLETE
Area-P 1/2: 4.49 cm2
S' Lateral: 2.5 cm

## 2022-11-20 MED ORDER — PERFLUTREN LIPID MICROSPHERE
1.0000 mL | INTRAVENOUS | Status: AC | PRN
Start: 2022-11-20 — End: 2022-11-20
  Administered 2022-11-20: 2 mL via INTRAVENOUS

## 2022-11-21 ENCOUNTER — Encounter: Payer: Self-pay | Admitting: Physician Assistant

## 2022-11-21 ENCOUNTER — Ambulatory Visit: Payer: 59 | Attending: Physician Assistant | Admitting: Physician Assistant

## 2022-11-21 ENCOUNTER — Other Ambulatory Visit (HOSPITAL_COMMUNITY): Payer: Self-pay

## 2022-11-21 VITALS — BP 160/110 | HR 71 | Ht 63.0 in | Wt 223.0 lb

## 2022-11-21 DIAGNOSIS — Z79899 Other long term (current) drug therapy: Secondary | ICD-10-CM | POA: Diagnosis not present

## 2022-11-21 DIAGNOSIS — R0989 Other specified symptoms and signs involving the circulatory and respiratory systems: Secondary | ICD-10-CM | POA: Diagnosis not present

## 2022-11-21 DIAGNOSIS — I4719 Other supraventricular tachycardia: Secondary | ICD-10-CM

## 2022-11-21 MED ORDER — AMLODIPINE BESYLATE 5 MG PO TABS: 5.0000 mg | ORAL_TABLET | ORAL | Status: DC | PRN

## 2022-11-21 MED ORDER — IRBESARTAN 150 MG PO TABS
150.0000 mg | ORAL_TABLET | Freq: Every day | ORAL | 1 refills | Status: DC
  Filled 2022-11-21: qty 60, 60d supply, fill #0

## 2022-11-21 NOTE — Patient Instructions (Addendum)
Medication Instructions:  STOP LOSARTAN  START IRBESARTAN 150 MG DAILY.  *If you need a refill on your cardiac medications before your next appointment, please call your pharmacy*   Lab Work: BMET IN 3 WEEKS  If you have labs (blood work) drawn today and your tests are completely normal, you will receive your results only by: MyChart Message (if you have MyChart) OR A paper copy in the mail If you have any lab test that is abnormal or we need to change your treatment, we will call you to review the results.   Testing/Procedures: NO TESTING   Follow-Up: At [facility], you and your health needs are our priority.  As part of our continuing mission to provide you with exceptional heart care, we have created designated Provider Care Teams.  These Care Teams include your primary Cardiologist (physician) and Advanced Practice Providers (APPs -  Physician Assistants and Nurse Practitioners) who all work together to provide you with the care you need, when you need it.    Your next appointment:   KEEP UPCOMING APPOINTMENT  Provider:   Zoila Shutter, MD  Other Instructions MONITOR BLOOD PRESSURE FOR 2 WEEKS IF SYSTOLIC IS 135 OR HIGHER INCREASE IRBESARTAN TO 300 MG DAILY( 2 OF THE 150 MG TABLETS).

## 2022-11-21 NOTE — Progress Notes (Unsigned)
Cardiology Office Note:  .   Date:  11/22/2022  ID:  Jasmine Barnes, DOB Aug 12, 1985, MRN [id number] PCP: Myrlene Broker, MD  [facility] Providers Cardiologist:  Chrystie Nose, MD     History of Present Illness: .   Jasmine Barnes is a 37 y.o. female with PMH of hypertension, PCOS, prediabetes, chest pain, fatigue, questionable diagnosis of A-fib and palpitation.  She was previously followed by Dr. Park Breed in [place].  She saw Dr. Rennis Golden for evaluation of multiple cardiac symptoms as a second opinion.  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  This was reviewed by Dr. Rennis Golden who found sinus tachycardia although patient reported no activity and that might trigger the fast heart rate, therefore there is some suspicion of possible inappropriate sinus tachycardia.  There was no evidence of atrial fibrillation.  She has undergone extensive testing in the past including echocardiogram and other ischemic evaluation which has been unremarkable.  She has significant family history of heart disease in her family and her sister had A-fib.  She was seen in the ED in June 2024 due to uncontrolled high blood pressure of 220/143.  She was treated for hypertensive emergency.  CT of the head and neck as well as MRI of the brain were unremarkable.  She reported prior history of mitral valve prolapse and also concern for obstructive sleep apnea.  She has daytime fatigue, poor sleep and snoring.  Dr. Rennis Golden ordered echocardiogram, coronary CT and a sleep study.  Her sleep study showed minimal OSA that occurs only in supine position, no treatment needed except to avoid sleeping supine.  Coronary CT showed coronary calcium score was 0, no evidence of CAD.  Echocardiogram obtained on 11/20/2022 showed EF 65 to 70%, no regional wall motion abnormality, mild LVH, normal RV, mild LAE, mild MR, mild prolapse of both leaflets of mitral valve.  Patient presents today for  follow-up.  I reviewed the echocardiogram and a coronary CT result with the patient.  She denies any significant chest pain.  Her primary concern is her blood pressure and tachycardia palpitation.  She says as long as she is on the sotalol, her palpitation is very well-controlled.  However when she stopped taking the sotalol, she will have recurrent palpitation even when she is not doing anything.  Palpitation is not occurring with body position changes.  Unclear if this is type of rebound tachycardia coming off of sotalol.  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 Rennis Golden.  I recommended continuing sotalol for the time being.  Her blood pressure has been elevated recently.  She takes 5 mg amlodipine as needed.  She is on 100 mg daily of losartan.  Her blood pressure remained uncontrolled, I plan to switch the losartan to a stronger irbesartan.  She will start on 150 mg daily of irbesartan, if blood pressure is still higher than 135 mmHg after 2 weeks, she has been instructed to increase irbesartan to 300 mg daily.  She can follow-up with Dr. Rennis Golden as previously scheduled.   ROS:   She denies chest pain, palpitations, dyspnea, pnd, orthopnea, n, v, dizziness, syncope, edema, weight gain, or early satiety. All other systems reviewed and are otherwise negative except as noted above.    Studies Reviewed: .        Cardiac Studies & Procedures       ECHOCARDIOGRAM  ECHOCARDIOGRAM COMPLETE 11/20/2022  Narrative ECHOCARDIOGRAM REPORT    Patient Name:   Jasmine Barnes Date of Exam: 11/20/2022 Medical Rec #:  [id number]        Height:       63.0 in Accession #:    [id number]       Weight:       222.2 lb Date of Birth:  1985-07-14        BSA:          2.022 m Patient Age:    37 years         BP:           132/98 mmHg Patient Gender: F                HR:           77 bpm. Exam Location:  [address]  Procedure: 2D Echo, 3D Echo, Cardiac Doppler, Color Doppler  and Intracardiac Opacification Agent  Indications:    R07.9 Chest pain R06.00 Dyspnea  History:        Patient has no prior history of Echocardiogram examinations. Risk Factors:Hypertension.  Sonographer:    Sedonia Small Rodgers-Jones RDCS Referring Phys: [id number] KENNETH C HILTY  IMPRESSIONS   1. Left ventricular ejection fraction, by estimation, is 65 to 70%. The left ventricle has normal function. The left ventricle has no regional wall motion abnormalities. There is mild concentric left ventricular hypertrophy. Left ventricular diastolic parameters were normal. The average left ventricular global longitudinal strain is -23.0 %. The global longitudinal strain is normal. 2. Right ventricular systolic function is normal. The right ventricular size is normal. 3. Left atrial size was mildly dilated. 4. The mitral valve is normal in structure. Mild mitral valve regurgitation. There is mild prolapse of both leaflets of the mitral valve. 5. The aortic valve is normal in structure. Aortic valve regurgitation is not visualized. 6. The inferior vena cava is normal in size with greater than 50% respiratory variability, suggesting right atrial pressure of 3 mmHg.  FINDINGS Left Ventricle: Left ventricular ejection fraction, by estimation, is 65 to 70%. The left ventricle has normal function. The left ventricle has no regional wall motion abnormalities. Definity contrast agent was given IV to delineate the left ventricular endocardial borders. The average left ventricular global longitudinal strain is -23.0 %. The global longitudinal strain is normal. The left ventricular internal cavity size was normal in size. There is mild concentric left ventricular hypertrophy. Left ventricular diastolic parameters were normal.  Right Ventricle: The right ventricular size is normal. Right vetricular wall thickness was not assessed. Right ventricular systolic function is normal.  Left Atrium: Left atrial size was  mildly dilated.  Right Atrium: Right atrial size was normal in size.  Pericardium: There is no evidence of pericardial effusion.  Mitral Valve: The mitral valve is normal in structure. There is mild prolapse of both leaflets of the mitral valve. Mild mitral valve regurgitation.  Tricuspid Valve: The tricuspid valve is normal in structure. Tricuspid valve regurgitation is mild.  Aortic Valve: The aortic valve is normal in structure. Aortic valve regurgitation is not visualized.  Pulmonic Valve: The pulmonic valve was normal in structure. Pulmonic valve regurgitation is trivial.  Aorta: The aortic root and ascending aorta are structurally normal, with no evidence of dilitation.  Venous: The inferior vena cava is normal in size with greater than 50% respiratory variability, suggesting right atrial pressure of 3 mmHg.  IAS/Shunts: No atrial level shunt detected by color flow Doppler.   LEFT VENTRICLE  PLAX 2D LVIDd:         4.10 cm   Diastology LVIDs:         2.50 cm   LV e' medial:    8.92 cm/s LV PW:         1.20 cm   LV E/e' medial:  8.8 LV IVS:        1.30 cm   LV e' lateral:   9.03 cm/s LVOT diam:     2.10 cm   LV E/e' lateral: 8.7 LV SV:         109 LV SV Index:   54        2D Longitudinal Strain LVOT Area:     3.46 cm  2D Strain GLS (A2C):   -22.5 % 2D Strain GLS (A3C):   -23.9 % 2D Strain GLS (A4C):   -22.4 % 2D Strain GLS Avg:     -23.0 %  3D Volume EF: 3D EF:        56 % LV EDV:       164 ml LV ESV:       72 ml LV SV:        92 ml  RIGHT VENTRICLE             IVC RV Basal diam:  4.00 cm     IVC diam: 1.60 cm RV S prime:     14.15 cm/s TAPSE (M-mode): 2.9 cm  LEFT ATRIUM             Index        RIGHT ATRIUM           Index LA diam:        4.10 cm 2.03 cm/m   RA Area:     13.20 cm LA Vol (A2C):   74.9 ml 37.04 ml/m  RA Volume:   34.80 ml  17.21 ml/m LA Vol (A4C):   72.0 ml 35.61 ml/m LA Biplane Vol: 74.6 ml 36.89 ml/m AORTIC VALVE LVOT Vmax:   131.50  cm/s LVOT Vmean:  86.800 cm/s LVOT VTI:    0.316 m  AORTA Ao Root diam: 3.30 cm Ao Asc diam:  3.40 cm  MITRAL VALVE               TRICUSPID VALVE MV Area (PHT): 4.49 cm    TR Peak grad:   27.5 mmHg MV Decel Time: 169 msec    TR Vmax:        262.00 cm/s MV E velocity: 78.35 cm/s MV A velocity: 57.35 cm/s  SHUNTS MV E/A ratio:  1.37        Systemic VTI:  0.32 m Systemic Diam: 2.10 cm  Dietrich Pates MD Electronically signed by Dietrich Pates MD Signature Date/Time: 11/20/2022/8:16:02 PM    Final    MONITORS  LONG TERM MONITOR (3-14 DAYS) 02/02/2022   CT SCANS  CT CORONARY MORPH W/CTA COR W/SCORE 11/02/2022  Narrative CLINICAL DATA:  Chest pain  EXAM: Cardiac/Coronary  CTA  TECHNIQUE: The patient was scanned on a Siemens Somatom go.Top scanner.  : A retrospective scan was triggered in the ascending thoracic aorta. Axial non-contrast 3 mm slices were carried out through the heart. The data set was analyzed on a dedicated work station and scored using the Agatson method. Gantry rotation speed was 330 msecs and collimation was .6 mm. 20mg  of metoprolol iv, and 0.8 mg of sl NTG was given. The 3D data set was reconstructed in 5% intervals of  the 60-95 % of the R-R cycle. Diastolic phases were analyzed on a dedicated work station using MPR, MIP and VRT modes. The patient received 100 cc of contrast.  FINDINGS: Aorta:  Normal size.  No calcifications.  No dissection.  Aortic Valve:  Trileaflet.  No calcifications.  Coronary Arteries:  Normal coronary origin.  Right dominance.  RCA is a dominant artery. There is no plaque.  Left main gives rise to LAD and LCX arteries. LM has no disease.  LAD has no plaque.  LCX is a non-dominant artery.  There is no plaque.  Other findings:  Normal pulmonary vein drainage into the left atrium.  Normal left atrial appendage without a thrombus.  Normal size of the pulmonary artery.  IMPRESSION: 1. Coronary calcium score of  0.  2. Normal coronary origin with right dominance.  3. No evidence of CAD.  4. CAD-RADS 0. Consider non-atherosclerotic causes of chest pain.   Electronically Signed By: Debbe Odea M.D. On: 11/02/2022 14:32          Risk Assessment/Calculations:     HYPERTENSION CONTROL Vitals:   11/21/22 1535 11/22/22 0828  BP: (!) 148/90 (!) 160/110    The patient's blood pressure is elevated above target today.  In order to address the patient's elevated BP: A current anti-hypertensive medication was adjusted today.      STOP-Bang Score:  4      Physical Exam:   VS:  BP (!) 160/110   Pulse 71   Ht 5\' 3"  (1.6 m)   Wt 223 lb (101.2 kg)   SpO2 99%   BMI 39.50 kg/m    Wt Readings from Last 3 Encounters:  11/21/22 223 lb (101.2 kg)  10/19/22 222 lb 3.2 oz (100.8 kg)  10/09/22 223 lb (101.2 kg)    GEN: Well nourished, well developed in no acute distress NECK: No JVD; No carotid bruits CARDIAC: RRR, no murmurs, rubs, gallops RESPIRATORY:  Clear to auscultation without rales, wheezing or rhonchi  ABDOMEN: Soft, non-tender, non-distended EXTREMITIES:  No edema; No deformity   ASSESSMENT AND PLAN: .    Labile hypertension -Her blood pressure has been running high lately.  She is on 100 mg of losartan, she uses 5 mg amlodipine as needed.  In the past month, she has used amlodipine 3 times.  I recommended switching losartan to a stronger ARB such as irbesartan 150 mg daily.  She will monitor her blood pressure at home, if after 2 weeks, systolic blood pressure still greater than 135 mmHg, I recommend increase irbesartan to 300 mg daily.  We will see the patient back in 2 to 3 weeks for reassessment.  Atrial tachycardia: Some suspicion for inappropriate sinus tachycardia based on previous note.  Patient says her tachycardia is not brought on by body position changes.  She noticed the tachycardia whenever she tried to come off of sotalol.  Not sure if this were present potential  rebound tachycardia.  However when she is taking the sotalol, her symptom is under very good control.  She does not feel stressed at work.  She is on antianxiety medication Wellbutrin.       Dispo: Follow-up in 2 to 3 weeks  Signed, Azalee Course, [place]

## 2022-11-22 ENCOUNTER — Other Ambulatory Visit (HOSPITAL_COMMUNITY): Payer: Self-pay

## 2022-12-04 ENCOUNTER — Ambulatory Visit: Payer: 59 | Admitting: Internal Medicine

## 2022-12-04 ENCOUNTER — Encounter: Payer: Self-pay | Admitting: Internal Medicine

## 2022-12-04 ENCOUNTER — Ambulatory Visit (INDEPENDENT_AMBULATORY_CARE_PROVIDER_SITE_OTHER): Payer: 59 | Admitting: Internal Medicine

## 2022-12-04 ENCOUNTER — Other Ambulatory Visit (HOSPITAL_COMMUNITY): Payer: Self-pay

## 2022-12-04 VITALS — BP 140/92 | HR 74 | Temp 98.1°F | Ht 63.0 in | Wt 228.0 lb

## 2022-12-04 DIAGNOSIS — I10 Essential (primary) hypertension: Secondary | ICD-10-CM

## 2022-12-04 DIAGNOSIS — R000 Tachycardia, unspecified: Secondary | ICD-10-CM | POA: Diagnosis not present

## 2022-12-04 DIAGNOSIS — F909 Attention-deficit hyperactivity disorder, unspecified type: Secondary | ICD-10-CM

## 2022-12-04 MED ORDER — BUPROPION HCL ER (XL) 300 MG PO TB24
300.0000 mg | ORAL_TABLET | Freq: Every day | ORAL | 1 refills | Status: DC
  Filled 2022-12-04: qty 90, 90d supply, fill #0

## 2022-12-04 MED ORDER — LISDEXAMFETAMINE DIMESYLATE 20 MG PO CAPS
20.0000 mg | ORAL_CAPSULE | Freq: Every day | ORAL | 0 refills | Status: DC
Start: 2022-12-04 — End: 2023-01-01
  Filled 2022-12-04: qty 30, 30d supply, fill #0

## 2022-12-04 NOTE — Patient Instructions (Addendum)
We can start back on the vyvanse will be fine to help with attention.  We can go up on wellbutrin to 300 mg daily

## 2022-12-04 NOTE — Progress Notes (Unsigned)
   Subjective:   Patient ID: Jasmine Barnes, female    DOB: 1985-06-21, 37 y.o.   MRN: [id number]  HPI The patient is a 37 YO female coming in for ADD follow up. 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 Stopped in July this year with possible heart problems. Has now clarified that and BP is more stable. Just started irbesartan 150 mg daily within last 2 weeks. 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   Review of Systems  Constitutional: Negative.   HENT: Negative.    Eyes: Negative.   Respiratory:  Negative for cough, chest tightness and shortness of breath.   Cardiovascular:  Negative for chest pain, palpitations and leg swelling.  Gastrointestinal:  Negative for abdominal distention, abdominal pain, constipation, diarrhea, nausea and vomiting.  Musculoskeletal: Negative.   Skin: Negative.   Neurological: Negative.   Psychiatric/Behavioral:  Positive for decreased concentration.     Objective:  Physical Exam Constitutional:      Appearance: She is well-developed.  HENT:     Head: Normocephalic and atraumatic.  Cardiovascular:     Rate and Rhythm: Normal rate and regular rhythm.  Pulmonary:     Effort: Pulmonary effort is normal. No respiratory distress.     Breath sounds: Normal breath sounds. No wheezing or rales.  Abdominal:     General: Bowel sounds are normal. There is no distension.     Palpations: Abdomen is soft.     Tenderness: There is no abdominal tenderness. There is no rebound.  Musculoskeletal:     Cervical back: Normal range of motion.  Skin:    General: Skin is warm and dry.  Neurological:     Mental Status: She is alert and oriented to person, place, and time.     Coordination: Coordination normal.     Vitals:   12/04/22 1546 12/04/22 1553  BP: (!) 140/100 (!) 140/100  Pulse: 74   Temp: 98.1 F (36.7 C)   TempSrc: Oral    SpO2: 99%   Weight: 228 lb (103.4 kg)   Height: 5\' 3"  (1.6 m)     Assessment & Plan:

## 2022-12-05 ENCOUNTER — Other Ambulatory Visit (HOSPITAL_COMMUNITY): Payer: Self-pay

## 2022-12-05 ENCOUNTER — Ambulatory Visit: Payer: 59 | Admitting: Internal Medicine

## 2022-12-05 DIAGNOSIS — F988 Other specified behavioral and emotional disorders with onset usually occurring in childhood and adolescence: Secondary | ICD-10-CM | POA: Insufficient documentation

## 2022-12-05 DIAGNOSIS — R000 Tachycardia, unspecified: Secondary | ICD-10-CM | POA: Insufficient documentation

## 2022-12-05 NOTE — Assessment & Plan Note (Signed)
Cardiology was able to review records and verify no A fib and this was removed from problem list and history today appropriately. She is taking sotalol 160 mg BID and will continue.

## 2022-12-05 NOTE — Assessment & Plan Note (Addendum)
Was able to verify past usage and prior diagnosis as well as medication and dosing. Rx vyvanse 20 mg daily and she will let us know in 2-3 weeks how this is working. Follow up 3 months.

## 2022-12-05 NOTE — Assessment & Plan Note (Signed)
BP is improving and likely will need increase in irbesartan. She will let us know in 2-3 weeks. Continue irbesartan 150 mg daily and sotalol 160 mg BID for now. Using amlodipine 5 mg daily prn for high readings.

## 2022-12-14 ENCOUNTER — Encounter: Payer: Self-pay | Admitting: Internal Medicine

## 2022-12-18 MED ORDER — IRBESARTAN 150 MG PO TABS: 300.0000 mg | ORAL_TABLET | Freq: Every day | ORAL | Status: DC

## 2022-12-26 ENCOUNTER — Encounter: Payer: Self-pay | Admitting: Internal Medicine

## 2022-12-26 ENCOUNTER — Encounter: Payer: Self-pay | Admitting: Physician Assistant

## 2022-12-26 ENCOUNTER — Ambulatory Visit: Payer: 59 | Attending: Physician Assistant | Admitting: Physician Assistant

## 2022-12-26 ENCOUNTER — Other Ambulatory Visit (HOSPITAL_COMMUNITY): Payer: Self-pay

## 2022-12-26 VITALS — BP 130/92 | HR 80 | Ht 63.0 in | Wt 227.0 lb

## 2022-12-26 DIAGNOSIS — R002 Palpitations: Secondary | ICD-10-CM | POA: Diagnosis not present

## 2022-12-26 DIAGNOSIS — I10 Essential (primary) hypertension: Secondary | ICD-10-CM | POA: Diagnosis not present

## 2022-12-26 MED ORDER — AMLODIPINE BESYLATE 10 MG PO TABS
10.0000 mg | ORAL_TABLET | Freq: Every day | ORAL | 3 refills | Status: DC
Start: 2022-12-26 — End: 2023-11-07
  Filled 2022-12-26: qty 90, 90d supply, fill #0

## 2022-12-26 MED ORDER — LOSARTAN POTASSIUM 100 MG PO TABS
100.0000 mg | ORAL_TABLET | Freq: Every day | ORAL | Status: DC
Start: 2022-12-26 — End: 2023-01-25

## 2022-12-26 NOTE — Progress Notes (Unsigned)
Cardiology Office Note:  .   Date:  12/26/2022  ID:  Jasmine Barnes, DOB January 09, 1986, MRN [id number] PCP: Myrlene Broker, MD  [facility] Providers Cardiologist:  Chrystie Nose, MD { Click to update primary MD,subspecialty MD or APP then REFRESH:1}   History of Present Illness: .   Jasmine Barnes is a 37 y.o. female with PMH of hypertension, PCOS, prediabetes, chest pain, fatigue, questionable diagnosis of A-fib and palpitation.  She was previously followed by Dr. Park Breed in [place].  She saw Dr. Rennis Golden for evaluation of multiple cardiac symptoms as a second opinion.  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  This was reviewed by Dr. Rennis Golden who found sinus tachycardia although patient reported no activity that might trigger the fast heart rate, therefore there is some suspicion of possible inappropriate sinus tachycardia.  There was no evidence of atrial fibrillation.  She has undergone extensive testing in the past including echocardiogram and other ischemic evaluation which has been unremarkable.  She has significant family history of heart disease in her family and her sister had A-fib.  She was seen in the ED in June 2024 due to uncontrolled high blood pressure of 220/143.  She was treated for hypertensive emergency.  CT of the head and neck as well as MRI of the brain were unremarkable.  She reported prior history of mitral valve prolapse and also concern for obstructive sleep apnea.  She has daytime fatigue, poor sleep and snoring.  Dr. Rennis Golden ordered echocardiogram, coronary CT and a sleep study.  Her sleep study showed minimal OSA that occurs only in supine position, no treatment needed except to avoid sleeping supine.  Coronary CT showed coronary calcium score was 0, no evidence of CAD.  Echocardiogram obtained on 11/20/2022 showed EF 65 to 70%, no regional wall motion abnormality, mild LVH, normal RV, mild LAE, mild MR, mild prolapse of  both leaflets of mitral valve.   I last saw the patient on 11/21/2022, I reviewed the echocardiogram and a coronary CT result with her.  She says as long as she is on sotalol, her palpitation was very well-controlled however when she stopped taking the sotalol, she has recurrent palpitation even though she was not doing anything.  Her blood pressure has been elevated she take 5 mg amlodipine as needed.  She is on 100 mg daily of losartan.  I switched her from losartan 250 mg daily of irbesartan.  I asked her to increase irbesartan to 300 mg daily after 2 weeks if systolic blood pressure still greater than 135 mmHg.  ROS: ***  Studies Reviewed: .        *** Risk Assessment/Calculations:   {Does this patient have ATRIAL FIBRILLATION?:[phone]} The patient's 1st BP is elevated (>139/89)*** Repeat BP and {Click to enter a 2nd BP Refresh Note  :1}   STOP-Bang Score:  4  { Consider Dx Sleep Disordered Breathing or Sleep Apnea  ICD G47.33          :1}    Physical Exam:   VS:  BP (!) 130/92   Pulse 80   Ht 5\' 3"  (1.6 m)   Wt 227 lb (103 kg)   LMP 11/20/2022   SpO2 97%   BMI 40.21 kg/m    Wt Readings from Last 3 Encounters:  12/26/22 227 lb (103 kg)  12/04/22 228 lb (103.4 kg)  11/21/22 223 lb (101.2 kg)    GEN:  Well nourished, well developed in no acute distress NECK: No JVD; No carotid bruits CARDIAC: ***RRR, no murmurs, rubs, gallops RESPIRATORY:  Clear to auscultation without rales, wheezing or rhonchi  ABDOMEN: Soft, non-tender, non-distended EXTREMITIES:  No edema; No deformity   ASSESSMENT AND PLAN: .   ***    {Are you ordering a CV Procedure (e.g. stress test, cath, DCCV, TEE, etc)?   Press F2        :[id number]}  Dispo: ***  Signed, Azalee Course, PA

## 2022-12-26 NOTE — Patient Instructions (Addendum)
Medication Instructions:  START LOSARTAN 100 MG DAILY  INCREASE AMLODIPINE TO 10 MG DAILY  *If you need a refill on your cardiac medications before your next appointment, please call your pharmacy*   Lab Work: RENIN ALDOSTERONE LEVEL If you have labs (blood work) drawn today and your tests are completely normal, you will receive your results only by: MyChart Message (if you have MyChart) OR A paper copy in the mail If you have any lab test that is abnormal or we need to change your treatment, we will call you to review the results.   Testing/Procedures:[address]  Your physician has requested that you have a renal artery duplex. During this test, an ultrasound is used to evaluate blood flow to the kidneys. Allow one hour for this exam. Do not eat after midnight the day before and avoid carbonated beverages. Take your medications as you usually do.    Follow-Up: At [facility], you and your health needs are our priority.  As part of our continuing mission to provide you with exceptional heart care, we have created designated Provider Care Teams.  These Care Teams include your primary Cardiologist (physician) and Advanced Practice Providers (APPs -  Physician Assistants and Nurse Practitioners) who all work together to provide you with the care you need, when you need it.  We recommend signing up for the patient portal called "MyChart".  Sign up information is provided on this After Visit Summary.  MyChart is used to connect with patients for Virtual Visits (Telemedicine).  Patients are able to view lab/test results, encounter notes, upcoming appointments, etc.  Non-urgent messages can be sent to your provider as well.   To learn more about what you can do with MyChart, go to [email or web address].    Your next appointment:   3-4 month(s)  Provider:   Chrystie Nose, MD

## 2022-12-27 ENCOUNTER — Other Ambulatory Visit (HOSPITAL_COMMUNITY): Payer: Self-pay

## 2022-12-29 ENCOUNTER — Telehealth: Payer: Self-pay

## 2022-12-29 NOTE — Telephone Encounter (Signed)
TRIED TO CALL PT MULTIPLE TIMES BUT VOICE MAIL IS FULL UNABLE TO LMVM. CALLED [phone] AND [id number].

## 2023-01-01 ENCOUNTER — Other Ambulatory Visit: Payer: Self-pay | Admitting: Internal Medicine

## 2023-01-01 ENCOUNTER — Other Ambulatory Visit (HOSPITAL_COMMUNITY): Payer: Self-pay

## 2023-01-01 ENCOUNTER — Other Ambulatory Visit: Payer: Self-pay

## 2023-01-01 MED ORDER — LISDEXAMFETAMINE DIMESYLATE 30 MG PO CAPS
30.0000 mg | ORAL_CAPSULE | Freq: Every day | ORAL | 0 refills | Status: DC
  Filled 2023-01-01: qty 30, 30d supply, fill #0

## 2023-01-02 ENCOUNTER — Other Ambulatory Visit: Payer: Self-pay

## 2023-01-04 ENCOUNTER — Other Ambulatory Visit (HOSPITAL_COMMUNITY): Payer: Self-pay

## 2023-01-08 MED ORDER — CLONAZEPAM 0.5 MG PO TABS
0.5000 mg | ORAL_TABLET | Freq: Two times a day (BID) | ORAL | 1 refills | Status: DC | PRN
  Filled 2023-01-08: qty 30, 15d supply, fill #0

## 2023-01-09 ENCOUNTER — Other Ambulatory Visit (HOSPITAL_COMMUNITY): Payer: Self-pay

## 2023-01-15 ENCOUNTER — Other Ambulatory Visit (HOSPITAL_COMMUNITY): Payer: Self-pay

## 2023-01-16 ENCOUNTER — Other Ambulatory Visit (HOSPITAL_COMMUNITY): Payer: Self-pay

## 2023-01-25 ENCOUNTER — Other Ambulatory Visit: Payer: Self-pay

## 2023-01-25 ENCOUNTER — Other Ambulatory Visit (HOSPITAL_COMMUNITY): Payer: Self-pay

## 2023-01-25 MED ORDER — LOSARTAN POTASSIUM 100 MG PO TABS
100.0000 mg | ORAL_TABLET | Freq: Every day | ORAL | 3 refills | Status: AC
  Filled 2023-01-25: qty 90, 90d supply, fill #0

## 2023-01-25 NOTE — Telephone Encounter (Signed)
 That's fine with me to cancel ultrasound

## 2023-01-26 ENCOUNTER — Ambulatory Visit (HOSPITAL_COMMUNITY): Admission: RE | Admit: 2023-01-26 | Payer: 59 | Source: Ambulatory Visit

## 2023-01-29 ENCOUNTER — Encounter: Payer: Self-pay | Admitting: Internal Medicine

## 2023-01-29 ENCOUNTER — Other Ambulatory Visit (HOSPITAL_BASED_OUTPATIENT_CLINIC_OR_DEPARTMENT_OTHER): Payer: Self-pay

## 2023-01-29 ENCOUNTER — Telehealth (INDEPENDENT_AMBULATORY_CARE_PROVIDER_SITE_OTHER): Payer: 59 | Admitting: Internal Medicine

## 2023-01-29 DIAGNOSIS — F909 Attention-deficit hyperactivity disorder, unspecified type: Secondary | ICD-10-CM

## 2023-01-29 MED ORDER — LISDEXAMFETAMINE DIMESYLATE 30 MG PO CAPS
30.0000 mg | ORAL_CAPSULE | Freq: Every day | ORAL | 0 refills | Status: AC
Start: 2023-01-29 — End: ?
  Filled 2023-01-29 – 2023-02-02 (×2): qty 30, 30d supply, fill #0

## 2023-01-29 NOTE — Progress Notes (Signed)
 Virtual Visit via Video Note  I connected with Jasmine Barnes on 01/29/23 at  8:20 AM EST by a video enabled telemedicine application and verified that I am speaking with the correct person using two identifiers.  The patient and the provider were at separate locations throughout the entire encounter. Patient location: home, Provider location: work   I discussed the limitations of evaluation and management by telemedicine and the availability of in person appointments. The patient expressed understanding and agreed to proceed. The patient and the provider were the only parties present for the visit unless noted in HPI below.  History of Present Illness: The patient is a 38 y.o. female with visit for follow up vyvanse  adjustment. Started taking 30 mg daily and is doing well with this. Able to focus better and work on schoolwork.   Observations/Objective: Appearance: normal, breathing appears normal, casual grooming, abdomen does not appear distended, A and O  Assessment and Plan: See problem oriented charting  Follow Up Instructions: keep vyvanse  30 mg daily and can refill as needed, follow up 6 months  I discussed the assessment and treatment plan with the patient. The patient was provided an opportunity to ask questions and all were answered. The patient agreed with the plan and demonstrated an understanding of the instructions.   The patient was advised to call back or seek an in-person evaluation if the symptoms worsen or if the condition fails to improve as anticipated.  Jasmine DELENA Cleveland, MD

## 2023-01-29 NOTE — Assessment & Plan Note (Signed)
 She is doing better with increased vyvanse dose 30 mg daily. She denies side effects such as poor appetite, sleep, high BP. Her BP is under good control now. Follow up 6 months. Refill as needed until that time.

## 2023-01-29 NOTE — Telephone Encounter (Signed)
 FYI

## 2023-01-30 ENCOUNTER — Other Ambulatory Visit (HOSPITAL_COMMUNITY): Payer: Self-pay

## 2023-02-02 ENCOUNTER — Other Ambulatory Visit (HOSPITAL_COMMUNITY): Payer: Self-pay

## 2023-02-02 ENCOUNTER — Other Ambulatory Visit: Payer: Self-pay

## 2023-02-02 ENCOUNTER — Encounter: Payer: Self-pay | Admitting: Internal Medicine

## 2023-02-03 ENCOUNTER — Other Ambulatory Visit (HOSPITAL_COMMUNITY): Payer: Self-pay

## 2023-02-05 ENCOUNTER — Other Ambulatory Visit: Payer: Self-pay | Admitting: Internal Medicine

## 2023-02-05 MED ORDER — LISDEXAMFETAMINE DIMESYLATE 30 MG PO CAPS: 30.0000 mg | ORAL_CAPSULE | Freq: Every day | ORAL | 0 refills | Status: DC

## 2023-02-05 NOTE — Telephone Encounter (Signed)
Copied from CRM [id number]. Topic: Clinical - Medication Refill >> Feb 05, 2023  1:46 PM Dennison Nancy wrote: Most Recent Primary Care Visit:  Provider: Hillard Danker A  Department: [facility]  Visit Type: MYCHART VIDEO VISIT  Date: 01/29/2023  Medication: lisdexamfetamine (VYVANSE) 30 MG capsule  Has the patient contacted their pharmacy? No  (Agent: If no, request that the patient contact the pharmacy for the refill. If patient does not wish to contact the pharmacy document the reason why and proceed with request.) (Agent: If yes, when and what did the pharmacy advise?)  Is this the correct pharmacy for this prescription? Yes If no, delete pharmacy and type the correct one.  This is the patient's preferred pharmacy:   [id number] - [address] [place] [address] Phone: [phone] Fax: [phone]   Has the prescription been filled recently?   Is the patient out of the medication? No  , have 4 left   Has the patient been seen for an appointment in the last year OR does the patient have an upcoming appointment? Yes  had an appointment on 01/29/23  Can we respond through MyChart? Yes  Agent: Please be advised that Rx refills may take up to 3 business days. We ask that you follow-up with your pharmacy.

## 2023-02-05 NOTE — Addendum Note (Signed)
Addended by: Hillard Danker A on: 02/05/2023 04:07 PM   Modules accepted: Orders

## 2023-02-05 NOTE — Telephone Encounter (Signed)
Please send in refill for vyvance for patient

## 2023-02-06 ENCOUNTER — Other Ambulatory Visit: Payer: Self-pay

## 2023-02-07 ENCOUNTER — Other Ambulatory Visit (HOSPITAL_COMMUNITY): Payer: Self-pay

## 2023-02-20 ENCOUNTER — Encounter: Payer: Self-pay | Admitting: Internal Medicine

## 2023-02-20 DIAGNOSIS — R0989 Other specified symptoms and signs involving the circulatory and respiratory systems: Secondary | ICD-10-CM

## 2023-02-21 ENCOUNTER — Other Ambulatory Visit (HOSPITAL_COMMUNITY): Payer: Self-pay

## 2023-02-21 MED ORDER — OMRON 3 SERIES BP MONITOR DEVI
1.0000 | Freq: Once | 0 refills | Status: AC
  Filled 2023-02-21: qty 1, 30d supply, fill #0

## 2023-02-21 NOTE — Telephone Encounter (Signed)
 Prescriptions for BP monitor sent to [facility] pharmacy per patient's request.

## 2023-02-21 NOTE — Telephone Encounter (Signed)
Please place order for a BP cuff send to [facility], thank you

## 2023-02-21 NOTE — Addendum Note (Signed)
 Addended by: Meghana Tullo on: 02/21/2023 12:21 PM   Modules accepted: Orders

## 2023-02-26 ENCOUNTER — Other Ambulatory Visit (HOSPITAL_COMMUNITY): Payer: Self-pay

## 2023-02-26 ENCOUNTER — Other Ambulatory Visit (HOSPITAL_BASED_OUTPATIENT_CLINIC_OR_DEPARTMENT_OTHER): Payer: Self-pay

## 2023-02-26 MED ORDER — BLOOD PRESSURE CUFF MISC
1.0000 | 0 refills | Status: AC
  Filled 2023-02-26: qty 1, 30d supply, fill #0

## 2023-02-27 ENCOUNTER — Other Ambulatory Visit (HOSPITAL_COMMUNITY): Payer: Self-pay

## 2023-03-01 ENCOUNTER — Other Ambulatory Visit (HOSPITAL_COMMUNITY): Payer: Self-pay

## 2023-03-06 IMAGING — CR DG CERVICAL SPINE COMPLETE 4+V
1 series · 5 of 5 positions shown · non-contrast
Comparison: None.

CLINICAL DATA: Acute neck pain without known injury.

EXAM:
CERVICAL SPINE - COMPLETE 4+ VIEW

[Series 1: dg cervical spine complete · 0.14mm/px · 5 of 5 slices shown]
[im 1/5]
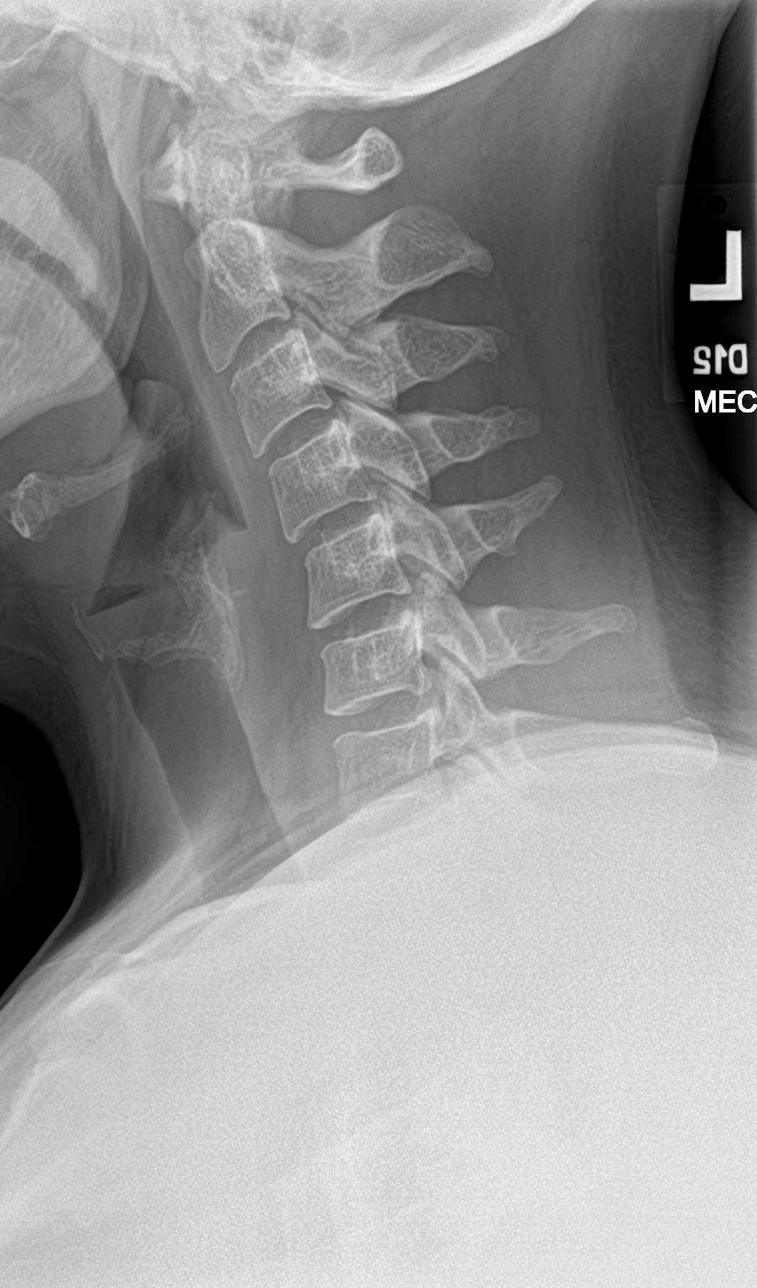
[im 2/5]
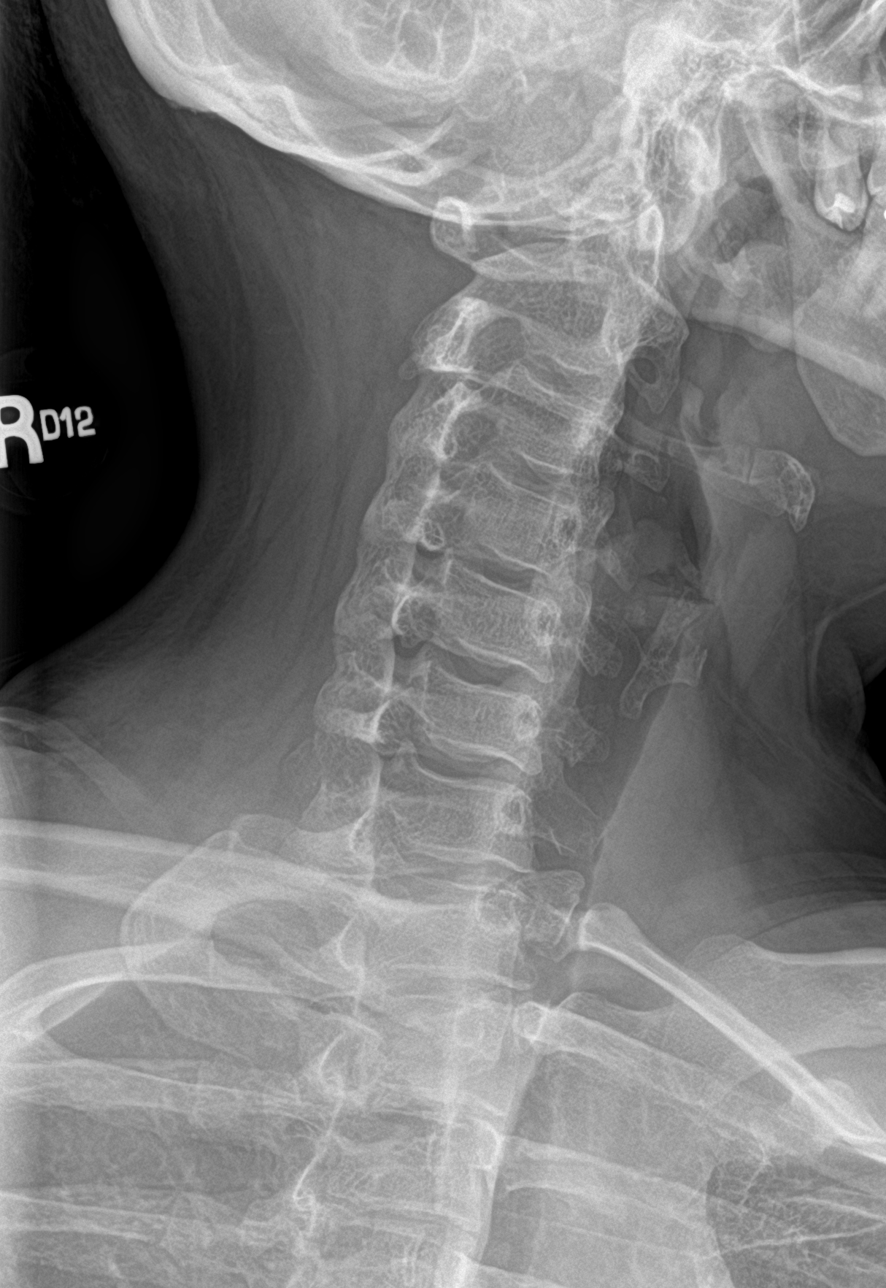
[im 3/5]
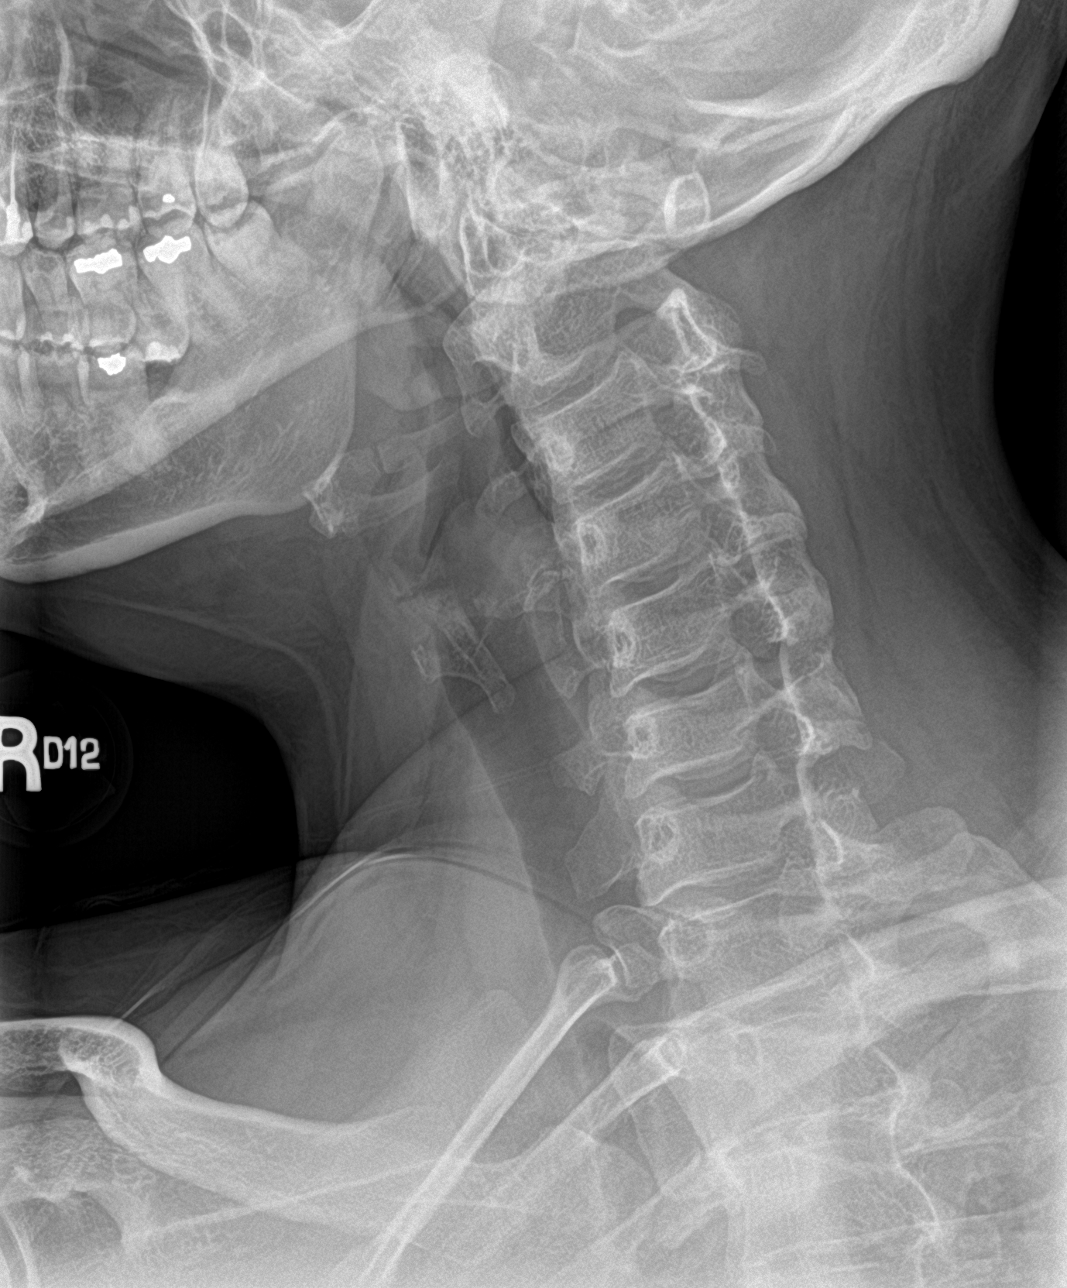
[im 4/5]
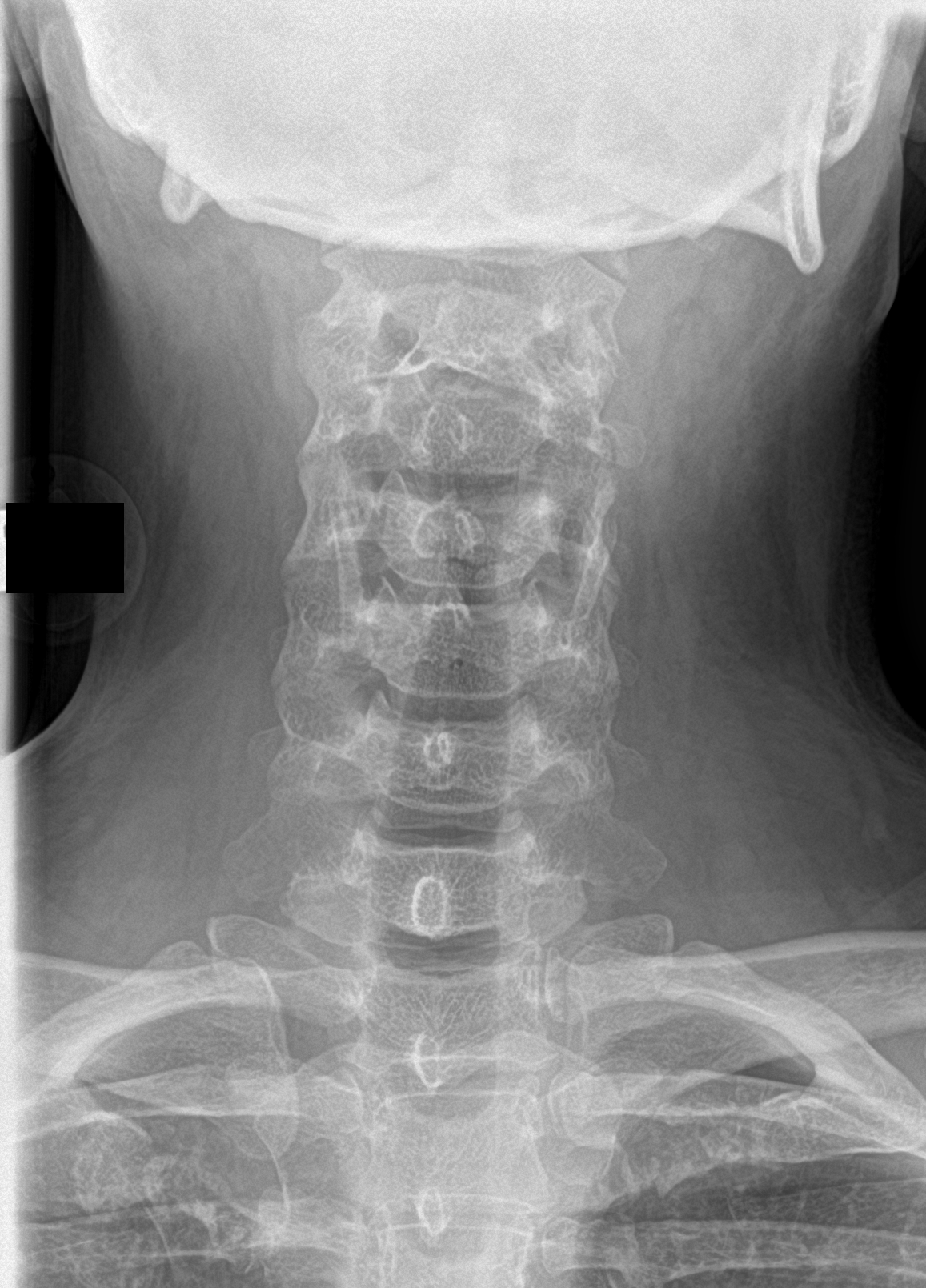
[im 5/5]
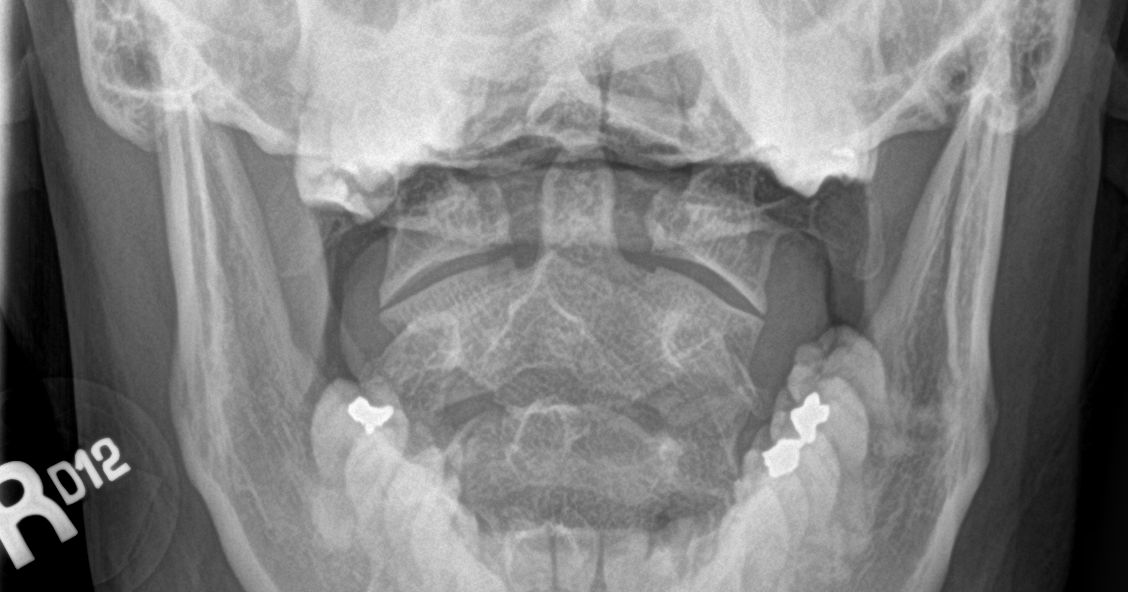

[5 of 5 positions shown; findings below may reference images not displayed]

FINDINGS: There is no evidence of cervical spine fracture or prevertebral soft
tissue swelling. Alignment is normal. No other significant bone
abnormalities are identified. No significant neural foraminal
stenosis.
IMPRESSION: Negative cervical spine radiographs.

## 2023-03-08 ENCOUNTER — Other Ambulatory Visit (HOSPITAL_COMMUNITY): Payer: Self-pay

## 2023-03-19 ENCOUNTER — Encounter: Payer: Self-pay | Admitting: Internal Medicine

## 2023-03-20 ENCOUNTER — Other Ambulatory Visit: Payer: Self-pay

## 2023-03-20 MED ORDER — LISDEXAMFETAMINE DIMESYLATE 30 MG PO CAPS: 30.0000 mg | ORAL_CAPSULE | Freq: Every day | ORAL | 0 refills | Status: DC

## 2023-03-20 NOTE — Telephone Encounter (Signed)
 Please refill for the patient

## 2023-03-30 ENCOUNTER — Ambulatory Visit: Payer: 59 | Admitting: Physician Assistant

## 2023-04-15 ENCOUNTER — Other Ambulatory Visit: Payer: Self-pay | Admitting: Internal Medicine

## 2023-04-17 ENCOUNTER — Other Ambulatory Visit: Payer: Self-pay | Admitting: Internal Medicine

## 2023-04-17 MED ORDER — LISDEXAMFETAMINE DIMESYLATE 30 MG PO CAPS: 30.0000 mg | ORAL_CAPSULE | Freq: Every day | ORAL | 0 refills | Status: DC

## 2023-05-27 ENCOUNTER — Other Ambulatory Visit: Payer: Self-pay | Admitting: Internal Medicine

## 2023-05-29 ENCOUNTER — Other Ambulatory Visit: Payer: Self-pay | Admitting: Internal Medicine

## 2023-05-30 ENCOUNTER — Encounter: Payer: Self-pay | Admitting: Internal Medicine

## 2023-06-01 MED ORDER — LISDEXAMFETAMINE DIMESYLATE 30 MG PO CAPS: 30.0000 mg | ORAL_CAPSULE | Freq: Every day | ORAL | 0 refills | Status: DC

## 2023-06-12 ENCOUNTER — Telehealth (INDEPENDENT_AMBULATORY_CARE_PROVIDER_SITE_OTHER): Admitting: Internal Medicine

## 2023-06-12 ENCOUNTER — Encounter: Payer: Self-pay | Admitting: Internal Medicine

## 2023-06-12 DIAGNOSIS — F909 Attention-deficit hyperactivity disorder, unspecified type: Secondary | ICD-10-CM | POA: Diagnosis not present

## 2023-06-12 DIAGNOSIS — I10 Essential (primary) hypertension: Secondary | ICD-10-CM | POA: Diagnosis not present

## 2023-06-12 DIAGNOSIS — F419 Anxiety disorder, unspecified: Secondary | ICD-10-CM | POA: Diagnosis not present

## 2023-06-12 DIAGNOSIS — D500 Iron deficiency anemia secondary to blood loss (chronic): Secondary | ICD-10-CM | POA: Diagnosis not present

## 2023-06-12 DIAGNOSIS — E782 Mixed hyperlipidemia: Secondary | ICD-10-CM

## 2023-06-12 MED ORDER — BUPROPION HCL ER (XL) 300 MG PO TB24: 300.0000 mg | ORAL_TABLET | Freq: Every day | ORAL | 3 refills | Status: DC

## 2023-06-12 NOTE — Assessment & Plan Note (Signed)
 BP at goal on sotalol  and losartan . Checking CMP and adjust as needed.

## 2023-06-12 NOTE — Assessment & Plan Note (Signed)
 Well controlled with vyvanse  30 mg daily and will continue.

## 2023-06-12 NOTE — Progress Notes (Signed)
 Virtual Visit via Video Note  I connected with Jasmine Barnes on 06/12/23 at  2:40 PM EDT by a video enabled telemedicine application and verified that I am speaking with the correct person using two identifiers.  The patient and the provider were at separate locations throughout the entire encounter. Patient location: home, Provider location: work   I discussed the limitations of evaluation and management by telemedicine and the availability of in person appointments. The patient expressed understanding and agreed to proceed. The patient and the provider were the only parties present for the visit unless noted in HPI below.  History of Present Illness: The patient is a 38 y.o. female with visit for follow up. ADD doing well and vyvanse  30 is fine. BP is normal. HR is normal. Taking sotalol  and takes amlodipine  prn only if needed for BP.  Observations/Objective: Appearance: normal, breathing appears normal, casual grooming, abdomen does not appear distended, mental status is A and O times 3  Assessment and Plan: See problem oriented charting  Follow Up Instructions: check labs routine, continue meds for now  I discussed the assessment and treatment plan with the patient. The patient was provided an opportunity to ask questions and all were answered. The patient agreed with the plan and demonstrated an understanding of the instructions.   The patient was advised to call back or seek an in-person evaluation if the symptoms worsen or if the condition fails to improve as anticipated.  Adelia Homestead, MD

## 2023-06-12 NOTE — Assessment & Plan Note (Signed)
Checking lipid panel and adjust as needed. Not on meds.  

## 2023-06-12 NOTE — Assessment & Plan Note (Signed)
Checking CBC for stability.

## 2023-06-12 NOTE — Assessment & Plan Note (Signed)
 Well controlled with wellbutrin  and continue refilled today.

## 2023-06-27 ENCOUNTER — Encounter: Payer: Self-pay | Admitting: Internal Medicine

## 2023-06-27 DIAGNOSIS — F419 Anxiety disorder, unspecified: Secondary | ICD-10-CM

## 2023-06-27 DIAGNOSIS — F909 Attention-deficit hyperactivity disorder, unspecified type: Secondary | ICD-10-CM

## 2023-06-27 NOTE — Telephone Encounter (Signed)
 Patient is a patient here at this office and is a patient of Dr Nicolette Barrio. Please send in refill as MD is out of office

## 2023-06-27 NOTE — Telephone Encounter (Signed)
Please send in refill as MD is out of office

## 2023-06-27 NOTE — Telephone Encounter (Signed)
 Copied from CRM [id number]. Topic: Clinical - Prescription Issue >> Jun 27, 2023  1:14 PM Albertha Alosa wrote: Reason for CRM: Patient called in regarding prescriptions clonazePAM  0.5 MG tablet lisdexamfetamine (VYVANSE ) 30 MG capsule , stated the provider told the pharmacy that patient isnt a patient at the office and did not refill the prescriptions

## 2023-06-29 MED ORDER — LISDEXAMFETAMINE DIMESYLATE 30 MG PO CAPS
30.0000 mg | ORAL_CAPSULE | Freq: Every day | ORAL | 0 refills | Status: DC
Start: 2023-06-29 — End: 2023-08-07

## 2023-06-29 MED ORDER — CLONAZEPAM 0.5 MG PO TABS
0.5000 mg | ORAL_TABLET | Freq: Two times a day (BID) | ORAL | 0 refills | Status: DC | PRN
Start: 2023-06-29 — End: 2023-11-07

## 2023-07-06 ENCOUNTER — Other Ambulatory Visit (INDEPENDENT_AMBULATORY_CARE_PROVIDER_SITE_OTHER)

## 2023-07-06 DIAGNOSIS — E782 Mixed hyperlipidemia: Secondary | ICD-10-CM

## 2023-07-06 LAB — LIPID PANEL
Cholesterol: 169 mg/dL (ref 0–200)
HDL: 39.7 mg/dL (ref >39.00)
LDL Cholesterol: 115 mg/dL — ABNORMAL HIGH (ref 0–99)
NonHDL: 128.9
Total CHOL/HDL Ratio: 4
Triglycerides: 70 mg/dL (ref 0.0–149.0)
VLDL: 14 mg/dL (ref 0.0–40.0)

## 2023-07-06 LAB — COMPREHENSIVE METABOLIC PANEL WITH GFR
ALT: 24 U/L (ref 0–35)
AST: 21 U/L (ref 0–37)
Albumin: 4.1 g/dL (ref 3.5–5.2)
Alkaline Phosphatase: 70 U/L (ref 39–117)
BUN: 9 mg/dL (ref 6–23)
CO2: 25 meq/L (ref 19–32)
Calcium: 8.8 mg/dL (ref 8.4–10.5)
Chloride: 108 meq/L (ref 96–112)
Creatinine, Ser: 0.75 mg/dL (ref 0.40–1.20)
GFR: 100.41 mL/min (ref >60.00)
Glucose, Bld: 68 mg/dL — ABNORMAL LOW (ref 70–99)
Potassium: 4.2 meq/L (ref 3.5–5.1)
Sodium: 139 meq/L (ref 135–145)
Total Bilirubin: 0.2 mg/dL (ref 0.2–1.2)
Total Protein: 6.9 g/dL (ref 6.0–8.3)

## 2023-07-06 LAB — CBC
HCT: 37.6 % (ref 36.0–46.0)
Hemoglobin: 12.5 g/dL (ref 12.0–15.0)
MCHC: 33.2 g/dL (ref 30.0–36.0)
MCV: 86 fl (ref 78.0–100.0)
Platelets: 245 10*3/uL (ref 150.0–400.0)
RBC: 4.37 Mil/uL (ref 3.87–5.11)
RDW: 13.9 % (ref 11.5–15.5)
WBC: 6 10*3/uL (ref 4.0–10.5)

## 2023-07-06 LAB — HEMOGLOBIN A1C: Hgb A1c MFr Bld: 5.5 % (ref 4.6–6.5)

## 2023-07-08 ENCOUNTER — Encounter: Payer: Self-pay | Admitting: Internal Medicine

## 2023-07-09 ENCOUNTER — Ambulatory Visit: Payer: Self-pay | Admitting: Internal Medicine

## 2023-07-17 DIAGNOSIS — Z713 Dietary counseling and surveillance: Secondary | ICD-10-CM | POA: Diagnosis not present

## 2023-07-17 DIAGNOSIS — E66812 Obesity, class 2: Secondary | ICD-10-CM | POA: Diagnosis not present

## 2023-07-17 DIAGNOSIS — Z6837 Body mass index (BMI) 37.0-37.9, adult: Secondary | ICD-10-CM | POA: Diagnosis not present

## 2023-07-23 ENCOUNTER — Ambulatory Visit: Admitting: Internal Medicine

## 2023-07-27 ENCOUNTER — Ambulatory Visit: Admitting: Internal Medicine

## 2023-07-27 ENCOUNTER — Telehealth: Payer: Self-pay

## 2023-07-27 ENCOUNTER — Telehealth: Admitting: Internal Medicine

## 2023-07-27 NOTE — Progress Notes (Signed)
 Virtual Visit via Video Note  I connected with Notnamed K Stipp on 07/27/23 at 10:40 AM EDT by a video enabled telemedicine application however she was unable to be reached.   Almarie DELENA Cleveland, MD

## 2023-07-27 NOTE — Telephone Encounter (Signed)
 I have called patient and lvm and also sent a text link for her to start her virtual. Phone was going straight to voicemail.

## 2023-08-05 ENCOUNTER — Encounter: Payer: Self-pay | Admitting: Internal Medicine

## 2023-08-05 NOTE — Progress Notes (Deleted)
  Cardiology Office Note   Date:  08/05/2023  ID:  Jasmine K Kaeding, DOB 28-Oct-1985, MRN [id number] PCP: Rollene Almarie LABOR, MD  [facility] Providers Cardiologist:  Vinie JAYSON Maxcy, MD    History of Present Illness Jasmine Barnes is a 38 y.o. female with a past medical history of HTN, PCOS, prediabetes, fatigue, palpitations. Previously followed by Dr. Deretha in [place]. She was later referred to Dr. Maxcy for evaluation of multiple cardiac symptoms. Wore a cardiac monitor in 01/2022 that showed sinus tachycardia although patient reported no activity that might trigger the fast heart rate, therefore there is some suspicion of possible inappropriate sinus tachycardia. There was no evidence of atrial fibrillation. She underwent coronary CTA in 10/2022 that showed a coronary calcium  score of 0, no evidence of CAD. Echocardiogram in 11/2022 showed EF 65-70%, no regional wall motion abnormalities, normal LV diastolic parameters, normal RV systolic function, mild MR.   Seen by Scot Ford in 12/2022 in clinic. At that time, her BP was elevated. Amlodipine  was increased to 10 mg daily, remained on losartan  100 mg daily. Renin/aldosterone levels and renal artery ultrasounds were ordered but never completed   HTN  -  - Renin aldosterone level and renal artery ultrasounds were ordered in 12/2022 but not completed *** -  - Continue amlodipine  10 mg daily, losartan  100 mg daily, sotalol  160 mg BID   Palpitations  - Cardiac monitor in 01/2022 showed sinus tachycardia. No atrial fibrillation  -  - Continue sotalol  160 mg BID   Mild MR  - Noted on echocardiogram in 11/2022 - Repeat echo in 3-5 years    ROS: ***  Studies Reviewed      *** Risk Assessment/Calculations {Does this patient have ATRIAL FIBRILLATION?[phone]} No BP recorded.  {Refresh Note OR Click here to enter BP  :1}***   STOP-Bang Score:  4  { Consider Dx Sleep Disordered Breathing or Sleep Apnea  ICD G47.33           :1}    Physical Exam VS:  There were no vitals taken for this visit.       Wt Readings from Last 3 Encounters:  12/26/22 227 lb (103 kg)  12/04/22 228 lb (103.4 kg)  11/21/22 223 lb (101.2 kg)    GEN: Well nourished, well developed in no acute distress NECK: No JVD; No carotid bruits CARDIAC: ***RRR, no murmurs, rubs, gallops RESPIRATORY:  Clear to auscultation without rales, wheezing or rhonchi  ABDOMEN: Soft, non-tender, non-distended EXTREMITIES:  No edema; No deformity   ASSESSMENT AND PLAN ***    {Are you ordering a CV Procedure (e.g. stress test, cath, DCCV, TEE, etc)?   Press F2        :[id number]}  Dispo: ***  Signed, Rollo FABIENE Louder, PA-C

## 2023-08-07 ENCOUNTER — Other Ambulatory Visit: Payer: Self-pay | Admitting: Family

## 2023-08-07 DIAGNOSIS — F909 Attention-deficit hyperactivity disorder, unspecified type: Secondary | ICD-10-CM

## 2023-08-07 MED ORDER — LISDEXAMFETAMINE DIMESYLATE 30 MG PO CAPS: 30.0000 mg | ORAL_CAPSULE | Freq: Every day | ORAL | 0 refills | Status: DC

## 2023-08-07 NOTE — Telephone Encounter (Signed)
 Please send in refill for patient

## 2023-08-16 ENCOUNTER — Encounter: Payer: Self-pay | Admitting: Cardiology

## 2023-08-16 ENCOUNTER — Ambulatory Visit: Attending: Cardiology | Admitting: Cardiology

## 2023-08-16 ENCOUNTER — Ambulatory Visit: Admitting: Cardiology

## 2023-08-16 VITALS — BP 129/90 | HR 66 | Ht 63.0 in | Wt 210.0 lb

## 2023-08-16 DIAGNOSIS — I340 Nonrheumatic mitral (valve) insufficiency: Secondary | ICD-10-CM

## 2023-08-16 DIAGNOSIS — R002 Palpitations: Secondary | ICD-10-CM

## 2023-08-16 DIAGNOSIS — I10 Essential (primary) hypertension: Secondary | ICD-10-CM

## 2023-08-16 DIAGNOSIS — K219 Gastro-esophageal reflux disease without esophagitis: Secondary | ICD-10-CM | POA: Diagnosis not present

## 2023-08-16 NOTE — Progress Notes (Signed)
 Cardiology Office Note   Date:  08/16/2023  ID:  Jasmine K Junkin, DOB 1985/10/15, MRN [id number] PCP: Rollene Almarie LABOR, MD  [facility] Providers Cardiologist:  Vinie JAYSON Maxcy, MD    History of Present Illness Jasmine Barnes is a 38 y.o. female with a past medical history of HTN, PCOS, prediabetes, fatigue, palpitations. Previously followed by Dr. Deretha in [place]. She was later referred to Dr. Maxcy for evaluation of multiple cardiac symptoms. Wore a cardiac monitor in 01/2022 that showed sinus tachycardia although patient reported no activity that might trigger the fast heart rate, therefore there is some suspicion of possible inappropriate sinus tachycardia. There was no evidence of atrial fibrillation. She underwent coronary CTA in 10/2022 that showed a coronary calcium  score of 0, no evidence of CAD. Echocardiogram in 11/2022 showed EF 65-70%, no regional wall motion abnormalities, normal LV diastolic parameters, normal RV systolic function, mild MR.   Seen by Scot Ford in 12/2022 in clinic. At that time, her BP was elevated. Amlodipine  was increased to 10 mg daily, remained on losartan  100 mg daily. Renin/aldosterone levels and renal artery ultrasounds were ordered but never completed   Today, patient presents for a follow-up appointment.  She has been monitoring her blood pressure at home and it has been well-controlled.  She reports that she has had high blood pressure since she was 38 years old and high blood pressure runs in her family.  She is compliant with her blood pressure medications.  She takes sotalol  for history of palpitations.  Notes that when she tries to exercise, her heart rate will become elevated.  Once in 03/2023, she tried to do CrossFit for the first time and noticed that her heart rate got into the 160s.  She was aware of the fluttering in her chest and this made her quite anxious.  Since then, she has not been exercising quite so hard.  Notes that if she  tries to walk up or down the stairs her heart rate will become elevated.  When she rests, her heart rate does calm back down.  She denies chest pain on exertion.  Denies excessive shortness of breath on exertion.  She has been having some acid reflux recently.  Has been taking Tums which helps a bit.  Notes that after eating she will get some of this discomfort and it usually goes away if she takes Tums or drinks a soda.  We discussed her coronary CTA from last year which was very reassuring.  Studies Reviewed Cardiac Studies & Procedures   ______________________________________________________________________________________________     ECHOCARDIOGRAM  ECHOCARDIOGRAM COMPLETE 11/20/2022  Narrative ECHOCARDIOGRAM REPORT    Patient Name:   Jasmine K Avakian Date of Exam: 11/20/2022 Medical Rec #:  [id number]        Height:       63.0 in Accession #:    [id number]       Weight:       222.2 lb Date of Birth:  April 30, 1985        BSA:          2.022 m Patient Age:    37 years         BP:           132/98 mmHg Patient Gender: F                HR:           77 bpm. Exam Location:  [address]  Procedure: 2D  Echo, 3D Echo, Cardiac Doppler, Color Doppler and Intracardiac Opacification Agent  Indications:    R07.9 Chest pain R06.00 Dyspnea  History:        Patient has no prior history of Echocardiogram examinations. Risk Factors:Hypertension.  Sonographer:    Carl Rodgers-Jones RDCS Referring Phys: [id number] KENNETH C HILTY  IMPRESSIONS   1. Left ventricular ejection fraction, by estimation, is 65 to 70%. The left ventricle has normal function. The left ventricle has no regional wall motion abnormalities. There is mild concentric left ventricular hypertrophy. Left ventricular diastolic parameters were normal. The average left ventricular global longitudinal strain is -23.0 %. The global longitudinal strain is normal. 2. Right ventricular systolic function is normal. The right ventricular  size is normal. 3. Left atrial size was mildly dilated. 4. The mitral valve is normal in structure. Mild mitral valve regurgitation. There is mild prolapse of both leaflets of the mitral valve. 5. The aortic valve is normal in structure. Aortic valve regurgitation is not visualized. 6. The inferior vena cava is normal in size with greater than 50% respiratory variability, suggesting right atrial pressure of 3 mmHg.  FINDINGS Left Ventricle: Left ventricular ejection fraction, by estimation, is 65 to 70%. The left ventricle has normal function. The left ventricle has no regional wall motion abnormalities. Definity  contrast agent was given IV to delineate the left ventricular endocardial borders. The average left ventricular global longitudinal strain is -23.0 %. The global longitudinal strain is normal. The left ventricular internal cavity size was normal in size. There is mild concentric left ventricular hypertrophy. Left ventricular diastolic parameters were normal.  Right Ventricle: The right ventricular size is normal. Right vetricular wall thickness was not assessed. Right ventricular systolic function is normal.  Left Atrium: Left atrial size was mildly dilated.  Right Atrium: Right atrial size was normal in size.  Pericardium: There is no evidence of pericardial effusion.  Mitral Valve: The mitral valve is normal in structure. There is mild prolapse of both leaflets of the mitral valve. Mild mitral valve regurgitation.  Tricuspid Valve: The tricuspid valve is normal in structure. Tricuspid valve regurgitation is mild.  Aortic Valve: The aortic valve is normal in structure. Aortic valve regurgitation is not visualized.  Pulmonic Valve: The pulmonic valve was normal in structure. Pulmonic valve regurgitation is trivial.  Aorta: The aortic root and ascending aorta are structurally normal, with no evidence of dilitation.  Venous: The inferior vena cava is normal in size with greater  than 50% respiratory variability, suggesting right atrial pressure of 3 mmHg.  IAS/Shunts: No atrial level shunt detected by color flow Doppler.   LEFT VENTRICLE PLAX 2D LVIDd:         4.10 cm   Diastology LVIDs:         2.50 cm   LV e' medial:    8.92 cm/s LV PW:         1.20 cm   LV E/e' medial:  8.8 LV IVS:        1.30 cm   LV e' lateral:   9.03 cm/s LVOT diam:     2.10 cm   LV E/e' lateral: 8.7 LV SV:         109 LV SV Index:   54        2D Longitudinal Strain LVOT Area:     3.46 cm  2D Strain GLS (A2C):   -22.5 % 2D Strain GLS (A3C):   -23.9 % 2D Strain GLS (A4C):   -22.4 %  2D Strain GLS Avg:     -23.0 %  3D Volume EF: 3D EF:        56 % LV EDV:       164 ml LV ESV:       72 ml LV SV:        92 ml  RIGHT VENTRICLE             IVC RV Basal diam:  4.00 cm     IVC diam: 1.60 cm RV S prime:     14.15 cm/s TAPSE (M-mode): 2.9 cm  LEFT ATRIUM             Index        RIGHT ATRIUM           Index LA diam:        4.10 cm 2.03 cm/m   RA Area:     13.20 cm LA Vol (A2C):   74.9 ml 37.04 ml/m  RA Volume:   34.80 ml  17.21 ml/m LA Vol (A4C):   72.0 ml 35.61 ml/m LA Biplane Vol: 74.6 ml 36.89 ml/m AORTIC VALVE LVOT Vmax:   131.50 cm/s LVOT Vmean:  86.800 cm/s LVOT VTI:    0.316 m  AORTA Ao Root diam: 3.30 cm Ao Asc diam:  3.40 cm  MITRAL VALVE               TRICUSPID VALVE MV Area (PHT): 4.49 cm    TR Peak grad:   27.5 mmHg MV Decel Time: 169 msec    TR Vmax:        262.00 cm/s MV E velocity: 78.35 cm/s MV A velocity: 57.35 cm/s  SHUNTS MV E/A ratio:  1.37        Systemic VTI:  0.32 m Systemic Diam: 2.10 cm  Vina Gull MD Electronically signed by Vina Gull MD Signature Date/Time: 11/20/2022/8:16:02 PM    Final    MONITORS  LONG TERM MONITOR (3-14 DAYS) 02/02/2022   CT SCANS  CT CORONARY MORPH W/CTA COR W/SCORE 11/02/2022  Addendum 11/28/2022 10:22 AM ADDENDUM REPORT: 11/28/2022 10:20  EXAM: OVER-READ INTERPRETATION  CT CHEST  The following  report is an over-read performed by radiologist Dr. Andrea Gasman of [facility] on 11/28/2022. This over-read does not include interpretation of cardiac or coronary anatomy or pathology. The coronary CTA interpretation by the cardiologist is attached.  COMPARISON:  None.  FINDINGS: Vascular: No aortic atherosclerosis. The included aorta is normal in caliber.  Mediastinum/nodes: No adenopathy or mass. Unremarkable esophagus.  Lungs: No focal airspace disease. No pulmonary nodule. No pleural fluid. The included airways are patent.  Upper abdomen: No acute or unexpected findings.  Musculoskeletal: There are no acute or suspicious osseous abnormalities.  IMPRESSION: No acute or unexpected extracardiac findings.   Electronically Signed By: Andrea Gasman M.D. On: 11/28/2022 10:20  Narrative CLINICAL DATA:  Chest pain  EXAM: Cardiac/Coronary  CTA  TECHNIQUE: The patient was scanned on a Siemens Somatom go.Top scanner.  : A retrospective scan was triggered in the ascending thoracic aorta. Axial non-contrast 3 mm slices were carried out through the heart. The data set was analyzed on a dedicated work station and scored using the Agatson method. Gantry rotation speed was 330 msecs and collimation was .6 mm. 20mg  of metoprolol  iv, and 0.8 mg of sl NTG was given. The 3D data set was reconstructed in 5% intervals of the 60-95 % of the R-R cycle. Diastolic phases were analyzed on a  dedicated work station using MPR, MIP and VRT modes. The patient received 100 cc of contrast.  FINDINGS: Aorta:  Normal size.  No calcifications.  No dissection.  Aortic Valve:  Trileaflet.  No calcifications.  Coronary Arteries:  Normal coronary origin.  Right dominance.  RCA is a dominant artery. There is no plaque.  Left main gives rise to LAD and LCX arteries. LM has no disease.  LAD has no plaque.  LCX is a non-dominant artery.  There is no plaque.  Other  findings:  Normal pulmonary vein drainage into the left atrium.  Normal left atrial appendage without a thrombus.  Normal size of the pulmonary artery.  IMPRESSION: 1. Coronary calcium  score of 0.  2. Normal coronary origin with right dominance.  3. No evidence of CAD.  4. CAD-RADS 0. Consider non-atherosclerotic causes of chest pain.  Electronically Signed: By: Redell Cave M.D. On: 11/02/2022 14:32     ______________________________________________________________________________________________       Risk Assessment/Calculations   Physical Exam VS:  BP (!) 129/90   Pulse 66   Ht 5' 3 (1.6 m)   Wt 210 lb (95.3 kg)   SpO2 100%   BMI 37.20 kg/m        Wt Readings from Last 3 Encounters:  08/16/23 210 lb (95.3 kg)  12/26/22 227 lb (103 kg)  12/04/22 228 lb (103.4 kg)    GEN: Well nourished, well developed in no acute distress.  Sitting comfortably on the exam table NECK: No JVD CARDIAC: RRR, no murmurs, rubs, gallops.  Radial pulses 2+ bilaterally RESPIRATORY:  Clear to auscultation without rales, wheezing or rhonchi.  Normal work of breathing on room air ABDOMEN: Soft, non-tender, non-distended EXTREMITIES:  No edema in bilateral lower extremities; No deformity   ASSESSMENT AND PLAN  HTN  - BP well controlled at home- in the 130s/80s at home. No symptoms of hypotension  - Continue amlodipine  10 mg daily, losartan  100 mg daily, sotalol  160 mg BID  - Instructed patient to increase physical activity as tolerated. She has been working on losing weight and has been following a healthier diet   Palpitations  - Cardiac monitor in 01/2022 showed sinus tachycardia. No atrial fibrillation  - Patient has been having some palpitations during exercise. Tried CrossFit in March and had HR up to the 160s. Has also had some palpitations when walking up stairs. These palpitations make her anxious and have caused her to limit her exercise. No palpitations when at rest    - Discussed that HR should increase with physical activity, and getting into the 160s during CrossFit is not atypical. Offered ETT to rule out arrhythmia, but we decided to hold off for now  - Encouraged patient to increase her physical activity as tolerated. Instructed her to make sure she is well hydrated before starting her exercise and drinking water while exercising  - Continue sotalol  160 mg BID  - Patient does not drink caffeine   GERD  - Patient has been having symptoms consistent with acid reflux. Sometimes feels some pressure in her chest, like she has to burp. If she drinks soda or takes a Tums, symptoms resolve. She did ask if symptoms could be related to her heart  - Reviewed coronary CTA from 10/2022 that showed a coronary calcium  score of 0, normal coronary origin, no evidence of CAD. Discussed that these findings are very reassuring. Echo in 11/2022 with normal EF, no wall motion abnormalities. She does not smoke and has been trying  to follow a healthy diet and exercise, encouraged her to continue these positive behaviors  - Recommended trial of omeprazole and follow up with PCP for GERD   Mild MR  - Noted on echocardiogram in 11/2022 - Repeat echo in 3-5 years   Of note, patient was due to EKG this visit, but she declined     Dispo: Follow up with Dr. Mona in   Signed, Rollo FABIENE Louder, PA-C

## 2023-08-16 NOTE — Patient Instructions (Signed)
 Medication Instructions:  No changes *If you need a refill on your cardiac medications before your next appointment, please call your pharmacy*  Lab Work: No labs If you have labs (blood work) drawn today and your tests are completely normal, you will receive your results only by: MyChart Message (if you have MyChart) OR A paper copy in the mail If you have any lab test that is abnormal or we need to change your treatment, we will call you to review the results.  Testing/Procedures: No testing  Follow-Up: At [facility], you and your health needs are our priority.  As part of our continuing mission to provide you with exceptional heart care, our providers are all part of one team.  This team includes your primary Cardiologist (physician) and Advanced Practice Providers or APPs (Physician Assistants and Nurse Practitioners) who all work together to provide you with the care you need, when you need it.  Your next appointment:   6 month(s)  Provider:   Vinie JAYSON Maxcy, MD    We recommend signing up for the patient portal called MyChart.  Sign up information is provided on this After Visit Summary.  MyChart is used to connect with patients for Virtual Visits (Telemedicine).  Patients are able to view lab/test results, encounter notes, upcoming appointments, etc.  Non-urgent messages can be sent to your provider as well.   To learn more about what you can do with MyChart, go to [email or web address].

## 2023-09-03 ENCOUNTER — Ambulatory Visit (INDEPENDENT_AMBULATORY_CARE_PROVIDER_SITE_OTHER): Admitting: Internal Medicine

## 2023-09-03 VITALS — BP 138/98 | HR 74 | Temp 98.2°F | Ht 63.0 in | Wt 206.0 lb

## 2023-09-03 DIAGNOSIS — F419 Anxiety disorder, unspecified: Secondary | ICD-10-CM | POA: Diagnosis not present

## 2023-09-03 DIAGNOSIS — Z0000 Encounter for general adult medical examination without abnormal findings: Secondary | ICD-10-CM

## 2023-09-03 DIAGNOSIS — E782 Mixed hyperlipidemia: Secondary | ICD-10-CM

## 2023-09-03 DIAGNOSIS — Z0001 Encounter for general adult medical examination with abnormal findings: Secondary | ICD-10-CM

## 2023-09-03 DIAGNOSIS — I10 Essential (primary) hypertension: Secondary | ICD-10-CM

## 2023-09-03 DIAGNOSIS — F909 Attention-deficit hyperactivity disorder, unspecified type: Secondary | ICD-10-CM | POA: Diagnosis not present

## 2023-09-03 MED ORDER — BUPROPION HCL ER (XL) 300 MG PO TB24: 300.0000 mg | ORAL_TABLET | Freq: Every day | ORAL | 3 refills | Status: DC

## 2023-09-03 MED ORDER — PANTOPRAZOLE SODIUM 40 MG PO TBEC: 40.0000 mg | DELAYED_RELEASE_TABLET | Freq: Every day | ORAL | 3 refills | Status: AC

## 2023-09-03 MED ORDER — SOTALOL HCL 160 MG PO TABS: 160.0000 mg | ORAL_TABLET | Freq: Two times a day (BID) | ORAL | 3 refills | Status: AC

## 2023-09-03 MED ORDER — LISDEXAMFETAMINE DIMESYLATE 30 MG PO CAPS
30.0000 mg | ORAL_CAPSULE | Freq: Every day | ORAL | 0 refills | Status: DC
Start: 2023-09-03 — End: 2023-10-12

## 2023-09-03 NOTE — Progress Notes (Signed)
" ° °  Subjective:   Patient ID: Jasmine Barnes, female    DOB: Sep 09, 1985, 37 y.o.   MRN: [id number]  The patient is here for physical. Pertinent topics discussed: Discussed the use of AI scribe software for clinical note transcription with the patient, who gave verbal consent to proceed.  History of Present Illness Jasmine Barnes is a 38 year old female who presents with worsening heartburn and inability to burp.  She experiences worsening heartburn and an inability to burp, describing it as a pressure that feels 'stuck' and does not move up or down. Over-the-counter medications like Tums and Pepto Bismol have not been effective. There are no changes in her diet, although she has reduced her intake of spicy foods and stopped drinking coffee, which previously triggered her heart rate issues.  She recently visited a cardiologist, who told her there were no blockages or calcium  buildup. Despite this, she continues to experience the sensation of gas in her chest. No new breathing troubles, diarrhea, or constipation are present.  She has a history of anxiety, particularly related to test-taking, which she feels may contribute to her overall stress levels. She recently failed a test by eight points, which has been a source of frustration.  She is currently taking sotalol  and has received her flu shot and is up to date on other vaccinations, including a tetanus shot in 2019.  PMH, [facility], social history reviewed and updated  Review of Systems  Constitutional: Negative.   HENT: Negative.    Eyes: Negative.   Respiratory:  Negative for cough, chest tightness and shortness of breath.   Cardiovascular:  Negative for chest pain, palpitations and leg swelling.  Gastrointestinal:  Positive for abdominal distention. Negative for abdominal pain, constipation, diarrhea, nausea and vomiting.       GERD  Musculoskeletal: Negative.   Skin: Negative.   Neurological: Negative.   Psychiatric/Behavioral:  Negative.      Objective:  Physical Exam Constitutional:      Appearance: She is well-developed.  HENT:     Head: Normocephalic and atraumatic.  Cardiovascular:     Rate and Rhythm: Normal rate and regular rhythm.  Pulmonary:     Effort: Pulmonary effort is normal. No respiratory distress.     Breath sounds: Normal breath sounds. No wheezing or rales.  Abdominal:     General: Bowel sounds are normal. There is no distension.     Palpations: Abdomen is soft.     Tenderness: There is no abdominal tenderness. There is no rebound.  Musculoskeletal:     Cervical back: Normal range of motion.  Skin:    General: Skin is warm and dry.  Neurological:     Mental Status: She is alert and oriented to person, place, and time.     Coordination: Coordination normal.     Vitals:   09/03/23 1533  BP: (!) 138/98  Pulse: 74  Temp: 98.2 F (36.8 C)  TempSrc: Oral  SpO2: 99%  Weight: 206 lb (93.4 kg)  Height: 5' 3 (1.6 m)    Assessment & Plan:   "

## 2023-09-05 NOTE — Assessment & Plan Note (Signed)
 Refill vyvanse  30 mg daily and this has helped with focus well.

## 2023-09-05 NOTE — Assessment & Plan Note (Signed)
 Lipid panel up to date continue diet controlled.

## 2023-09-05 NOTE — Assessment & Plan Note (Signed)
 BMI 36 and complicated by GERD. Counseled about diet and exercise.

## 2023-09-05 NOTE — Assessment & Plan Note (Signed)
 Taking wellbutrin  and overall stable. Uses clonazepam  0.5 mg BID prn rarely.

## 2023-09-06 DIAGNOSIS — Z0000 Encounter for general adult medical examination without abnormal findings: Secondary | ICD-10-CM | POA: Insufficient documentation

## 2023-09-06 NOTE — Assessment & Plan Note (Signed)
Flu shot yearly. Tetanus up to date. Pap smear up to date with gyn. Counseled about sun safety and mole surveillance. Counseled about the dangers of distracted driving. Given 10 year screening recommendations.

## 2023-09-06 NOTE — Assessment & Plan Note (Signed)
 BP elevated today but normal at home. Will continue amlodipine  and sotalol  and losartan 

## 2023-10-11 ENCOUNTER — Encounter: Payer: Self-pay | Admitting: Internal Medicine

## 2023-10-11 DIAGNOSIS — F909 Attention-deficit hyperactivity disorder, unspecified type: Secondary | ICD-10-CM

## 2023-10-12 MED ORDER — LISDEXAMFETAMINE DIMESYLATE 30 MG PO CAPS: 30.0000 mg | ORAL_CAPSULE | Freq: Every day | ORAL | 0 refills | Status: DC

## 2023-10-12 NOTE — Telephone Encounter (Signed)
 Please Jasmine Barnes

## 2023-11-06 ENCOUNTER — Encounter: Payer: Self-pay | Admitting: Internal Medicine

## 2023-11-06 DIAGNOSIS — F419 Anxiety disorder, unspecified: Secondary | ICD-10-CM

## 2023-11-06 DIAGNOSIS — F909 Attention-deficit hyperactivity disorder, unspecified type: Secondary | ICD-10-CM

## 2023-11-07 MED ORDER — CLONAZEPAM 0.5 MG PO TABS: 0.5000 mg | ORAL_TABLET | Freq: Two times a day (BID) | ORAL | 0 refills | Status: DC | PRN

## 2023-11-07 MED ORDER — AMLODIPINE BESYLATE 10 MG PO TABS: 10.0000 mg | ORAL_TABLET | Freq: Every day | ORAL | 3 refills | Status: AC

## 2023-11-07 NOTE — Telephone Encounter (Signed)
 Ok to send in for amlodipine ?   And can you send in for the clonazepam  pharmacy is correct on file

## 2023-11-16 ENCOUNTER — Other Ambulatory Visit: Payer: Self-pay | Admitting: Internal Medicine

## 2023-11-30 MED ORDER — LISDEXAMFETAMINE DIMESYLATE 30 MG PO CAPS: 30.0000 mg | ORAL_CAPSULE | Freq: Every day | ORAL | 0 refills | Status: DC

## 2023-11-30 NOTE — Addendum Note (Signed)
 Addended by: ROLLENE NORRIS A on: 11/30/2023 03:05 PM   Modules accepted: Orders

## 2024-01-07 ENCOUNTER — Encounter: Payer: Self-pay | Admitting: Internal Medicine

## 2024-01-09 ENCOUNTER — Other Ambulatory Visit: Payer: Self-pay | Admitting: Family

## 2024-01-09 DIAGNOSIS — F909 Attention-deficit hyperactivity disorder, unspecified type: Secondary | ICD-10-CM

## 2024-01-09 MED ORDER — LISDEXAMFETAMINE DIMESYLATE 30 MG PO CAPS: 30.0000 mg | ORAL_CAPSULE | Freq: Every day | ORAL | 0 refills | Status: DC

## 2024-01-19 ENCOUNTER — Other Ambulatory Visit: Payer: Self-pay | Admitting: Internal Medicine

## 2024-01-19 DIAGNOSIS — F419 Anxiety disorder, unspecified: Secondary | ICD-10-CM

## 2024-01-24 ENCOUNTER — Encounter: Payer: Self-pay | Admitting: Internal Medicine

## 2024-02-20 ENCOUNTER — Ambulatory Visit: Admitting: Internal Medicine

## 2024-02-20 ENCOUNTER — Encounter: Payer: Self-pay | Admitting: Internal Medicine

## 2024-02-22 ENCOUNTER — Encounter: Payer: Self-pay | Admitting: Internal Medicine

## 2024-02-22 ENCOUNTER — Ambulatory Visit: Admitting: Internal Medicine

## 2024-02-22 VITALS — BP 126/80 | HR 76 | Temp 97.8°F | Ht 63.0 in | Wt 183.6 lb

## 2024-02-22 DIAGNOSIS — E782 Mixed hyperlipidemia: Secondary | ICD-10-CM

## 2024-02-22 DIAGNOSIS — R21 Rash and other nonspecific skin eruption: Secondary | ICD-10-CM

## 2024-02-22 DIAGNOSIS — F909 Attention-deficit hyperactivity disorder, unspecified type: Secondary | ICD-10-CM

## 2024-02-22 DIAGNOSIS — L709 Acne, unspecified: Secondary | ICD-10-CM

## 2024-02-22 LAB — LIPID PANEL
Cholesterol: 186 mg/dL (ref 28–200)
HDL: 54.8 mg/dL
LDL Cholesterol: 112 mg/dL — ABNORMAL HIGH (ref 10–99)
NonHDL: 131.12
Total CHOL/HDL Ratio: 3
Triglycerides: 94 mg/dL (ref 10.0–149.0)
VLDL: 18.8 mg/dL (ref 0.0–40.0)

## 2024-02-22 LAB — COMPREHENSIVE METABOLIC PANEL WITH GFR
ALT: 13 U/L (ref 3–35)
AST: 20 U/L (ref 5–37)
Albumin: 4.5 g/dL (ref 3.5–5.2)
Alkaline Phosphatase: 82 U/L (ref 39–117)
BUN: 12 mg/dL (ref 6–23)
CO2: 28 meq/L (ref 19–32)
Calcium: 9.2 mg/dL (ref 8.4–10.5)
Chloride: 106 meq/L (ref 96–112)
Creatinine, Ser: 0.63 mg/dL (ref 0.40–1.20)
GFR: 111.38 mL/min
Glucose, Bld: 84 mg/dL (ref 70–99)
Potassium: 4 meq/L (ref 3.5–5.1)
Sodium: 140 meq/L (ref 135–145)
Total Bilirubin: 0.2 mg/dL (ref 0.2–1.2)
Total Protein: 7.7 g/dL (ref 6.0–8.3)

## 2024-02-22 LAB — CBC
HCT: 36.3 % (ref 36.0–46.0)
Hemoglobin: 11.8 g/dL — ABNORMAL LOW (ref 12.0–15.0)
MCHC: 32.7 g/dL (ref 30.0–36.0)
MCV: 83.9 fl (ref 78.0–100.0)
Platelets: 293 10*3/uL (ref 150.0–400.0)
RBC: 4.32 Mil/uL (ref 3.87–5.11)
RDW: 15.4 % (ref 11.5–15.5)
WBC: 4.6 10*3/uL (ref 4.0–10.5)

## 2024-02-22 MED ORDER — BENZOYL PEROXIDE 5 % EX LIQD: Freq: Two times a day (BID) | CUTANEOUS | 12 refills | Status: AC

## 2024-02-22 MED ORDER — LISDEXAMFETAMINE DIMESYLATE 30 MG PO CAPS: 30.0000 mg | ORAL_CAPSULE | Freq: Every day | ORAL | 0 refills | Status: AC

## 2024-02-22 MED ORDER — TRIAMCINOLONE ACETONIDE 0.1 % EX CREA: 1.0000 | TOPICAL_CREAM | Freq: Two times a day (BID) | CUTANEOUS | 0 refills | Status: AC

## 2024-02-22 NOTE — Patient Instructions (Addendum)
 We have sent in the triamcinolone  cream to use on the neck.  We have sent in benzoyl peroxide  to use on the face as a rinse daily when needed.

## 2024-02-22 NOTE — Progress Notes (Signed)
" ° °  Subjective:   Patient ID: Jasmine Barnes, female    DOB: [birth year], 39 y.o.   MRN: [id number]  Discussed the use of AI scribe software for clinical note transcription with the patient, who gave verbal consent to proceed.  History of Present Illness Jasmine Barnes is a 39 year old female who presents for a routine follow-up to discuss weight management and preventive health screenings.  She has experienced significant weight loss of approximately 20 to 25 pounds since her last visit, attributed to a natural compound she has been taking monthly, initially taken bi-weekly. She feels good, her blood pressure is stable, and she has not experienced any anxiety attacks recently.  She maintains a healthy diet, avoiding fast food for over five years, and does not consume coffee or soda. She hydrates with water and Pedialyte, as she feels water alone does not quench her thirst. She consumes alcohol, such as wine, about three times a week.  She wants to maintain her current weight to avoid appearing too thin or unhealthy.   Review of Systems  Constitutional: Negative.   HENT: Negative.    Eyes: Negative.   Respiratory:  Negative for cough, chest tightness and shortness of breath.   Cardiovascular:  Negative for chest pain, palpitations and leg swelling.  Gastrointestinal:  Negative for abdominal distention, abdominal pain, constipation, diarrhea, nausea and vomiting.  Musculoskeletal: Negative.   Skin:  Positive for rash.  Neurological: Negative.   Psychiatric/Behavioral: Negative.      Objective:  Physical Exam Constitutional:      Appearance: She is well-developed.  HENT:     Head: Normocephalic and atraumatic.  Cardiovascular:     Rate and Rhythm: Normal rate and regular rhythm.  Pulmonary:     Effort: Pulmonary effort is normal. No respiratory distress.     Breath sounds: Normal breath sounds. No wheezing or rales.  Abdominal:     General: Bowel sounds are normal. There is no  distension.     Palpations: Abdomen is soft.     Tenderness: There is no abdominal tenderness.  Musculoskeletal:     Cervical back: Normal range of motion.  Skin:    General: Skin is warm and dry.     Comments: Rash posterior neck consistent with eczema, acne noted on the chin and cheeks  Neurological:     Mental Status: She is alert and oriented to person, place, and time.     Coordination: Coordination normal.     Vitals:   02/22/24 1148  BP: 126/80  Pulse: 76  Temp: 97.8 F (36.6 C)  TempSrc: Temporal  SpO2: 98%  Weight: 183 lb 9.6 oz (83.3 kg)  Height: 5' 3 (1.6 m)    Assessment and Plan Assessment & Plan Mixed hyperlipidemia   Previous cholesterol levels were elevated. Ordered a cholesterol panel to recheck levels.  ADHD  Refilled vyanse and she is tolerating well. BP at goal and appetite stable.   Rash  Rx triamcinolone  cream to use  Acne Rx benzoyl peroxide  to use topically on the face   "

## 2024-03-03 ENCOUNTER — Ambulatory Visit: Admitting: Internal Medicine

## 2024-05-02 ENCOUNTER — Ambulatory Visit: Admitting: Internal Medicine
